# Patient Record
Sex: Female | Born: 1995 | Race: White | Hispanic: No | Marital: Single | Smoking: Never smoker
Health system: Southern US, Community
[De-identification: ages and names within clinical notes are randomized; demographics above are authoritative.]

## PROBLEM LIST (undated history)

## (undated) DIAGNOSIS — K219 Gastro-esophageal reflux disease without esophagitis: Secondary | ICD-10-CM

## (undated) DIAGNOSIS — D649 Anemia, unspecified: Secondary | ICD-10-CM

## (undated) DIAGNOSIS — F419 Anxiety disorder, unspecified: Secondary | ICD-10-CM

## (undated) HISTORY — PX: NO PAST SURGERIES: SHX2092

---
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
## 2017-05-15 ENCOUNTER — Other Ambulatory Visit: Payer: Self-pay | Admitting: Obstetrics and Gynecology

## 2017-05-15 DIAGNOSIS — N644 Mastodynia: Secondary | ICD-10-CM

## 2017-05-17 ENCOUNTER — Ambulatory Visit
Admission: RE | Admit: 2017-05-17 | Discharge: 2017-05-17 | Disposition: A | Discharge status: Home / Self Care | Payer: 59 | Source: Ambulatory Visit | Attending: Obstetrics and Gynecology | Admitting: Obstetrics and Gynecology

## 2017-05-17 DIAGNOSIS — N644 Mastodynia: Secondary | ICD-10-CM

## 2018-04-15 IMAGING — US US BREAST*R* LIMITED INC AXILLA
1 series · 2 of 2 positions shown · non-contrast
Comparison: Previous exam(s).

CLINICAL DATA: 21-year-old patient is sent for evaluation bilateral
breast tenderness. The patient describes a lumpy area in the 11
o'clock region of the left breast with a burning sensation and lumpy
area in the 1 o'clock region of the right breast. There is a family
history of breast cancer in her paternal grandmother in her late
60s. The patient recently started oral contraceptives. Patient's
mother is with her today.

EXAM:
ULTRASOUND OF THE BILATERAL BREASTS

[Series 1: us breast*right* limited inc axilla · 0.07mm/px · 2 of 2 slices shown]
[im 1/2]
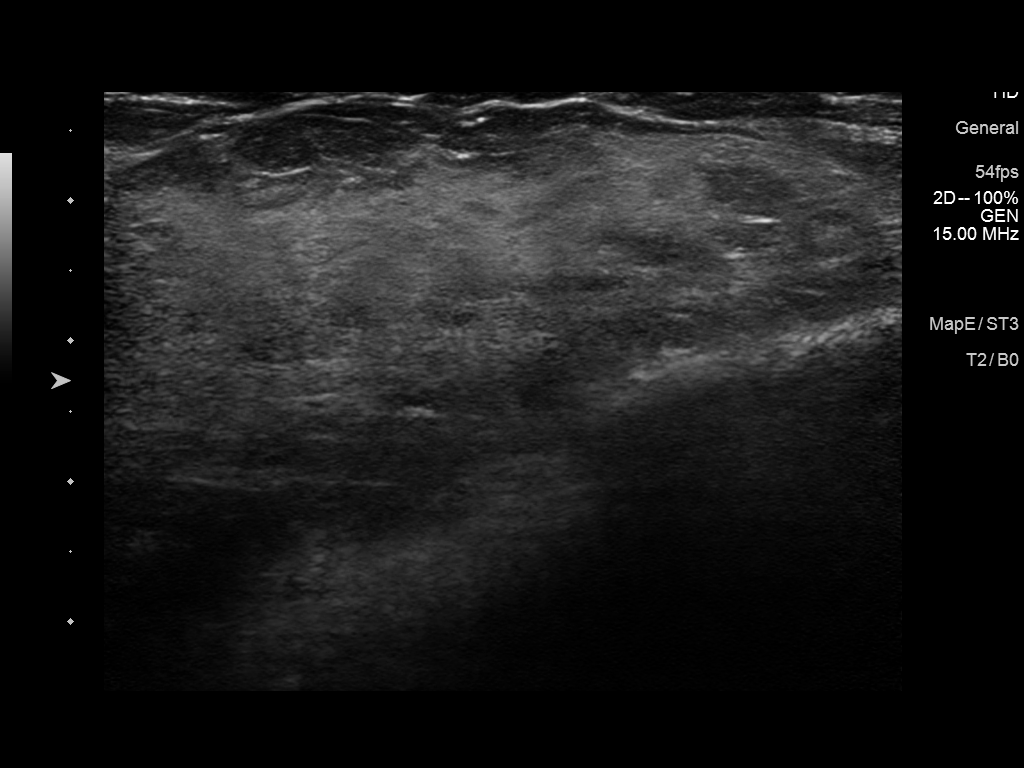
[im 2/2]
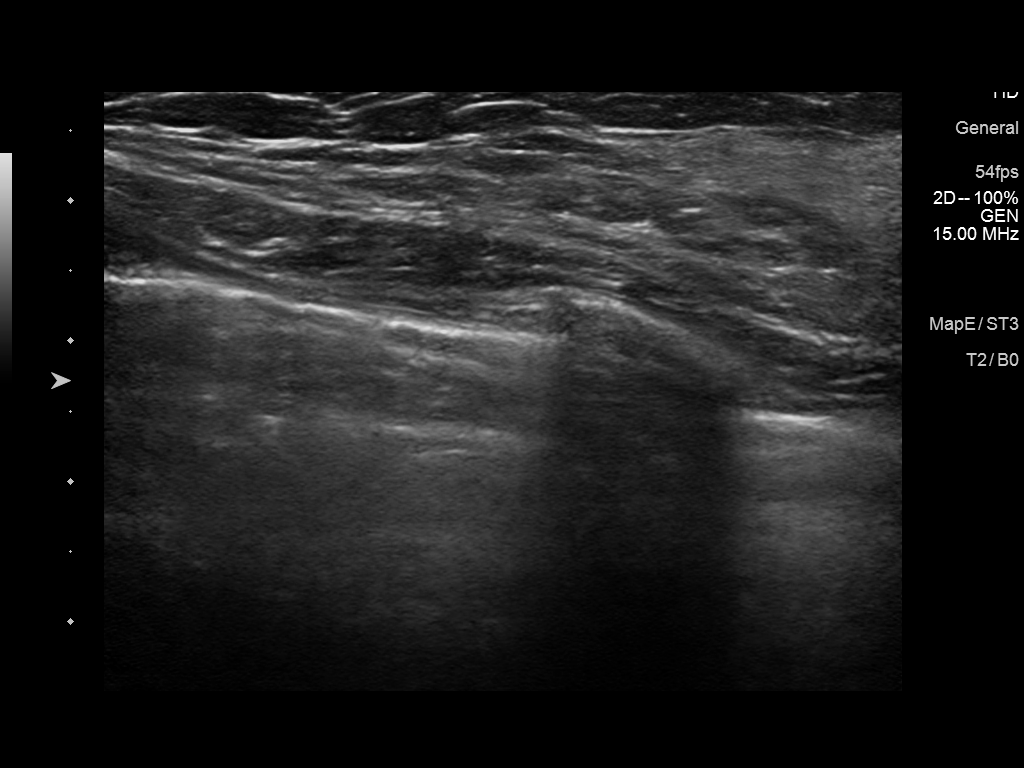

[2 of 2 positions shown; findings below may reference images not displayed]

FINDINGS: On physical exam, I palpate flat, discoid probable fibroglandular
tissue in the 11 o'clock region of the left breast and similar flat,
discoid glandular tissue in the 1 o'clock region of the right
breast. The skin of both breasts appears normal. I do not palpate a
discreet mass in either breast.

Targeted ultrasound is performed, showing dense fibroglandular
tissue in the 11:00 o'clock region of the left breast and dense
fibroglandular tissue and in the 1 o'clock region of the right
breast. No solid or cystic mass or abnormal shadowing is identified
on ultrasound of either breast to suggest malignancy.
IMPRESSION: No sonographic evidence of malignancy in either breast.

RECOMMENDATION:
Continued self breast exams and routine annual doctor's visits. The
patient is asked to return if she has any new or progressive areas
of concern in either breast. Otherwise, annual screening mammography
at 40 is recommended.

I have discussed the findings and recommendations with the patient.
Results were also provided in writing at the conclusion of the
visit. If applicable, a reminder letter will be sent to the patient
regarding the next appointment.

BI-RADS CATEGORY  1: Negative.

## 2018-04-15 IMAGING — US US BREAST*L* LIMITED INC AXILLA
1 series · 1 of 1 positions shown · non-contrast
Comparison: Previous exam(s).

CLINICAL DATA: 21-year-old patient is sent for evaluation bilateral
breast tenderness. The patient describes a lumpy area in the 11
o'clock region of the left breast with a burning sensation and lumpy
area in the 1 o'clock region of the right breast. There is a family
history of breast cancer in her paternal grandmother in her late
60s. The patient recently started oral contraceptives. Patient's
mother is with her today.

EXAM:
ULTRASOUND OF THE BILATERAL BREASTS

[Series 1: us breast*left* limited inc axilla · 0.07mm/px · 1 of 1 slices shown]
[im 1/1]
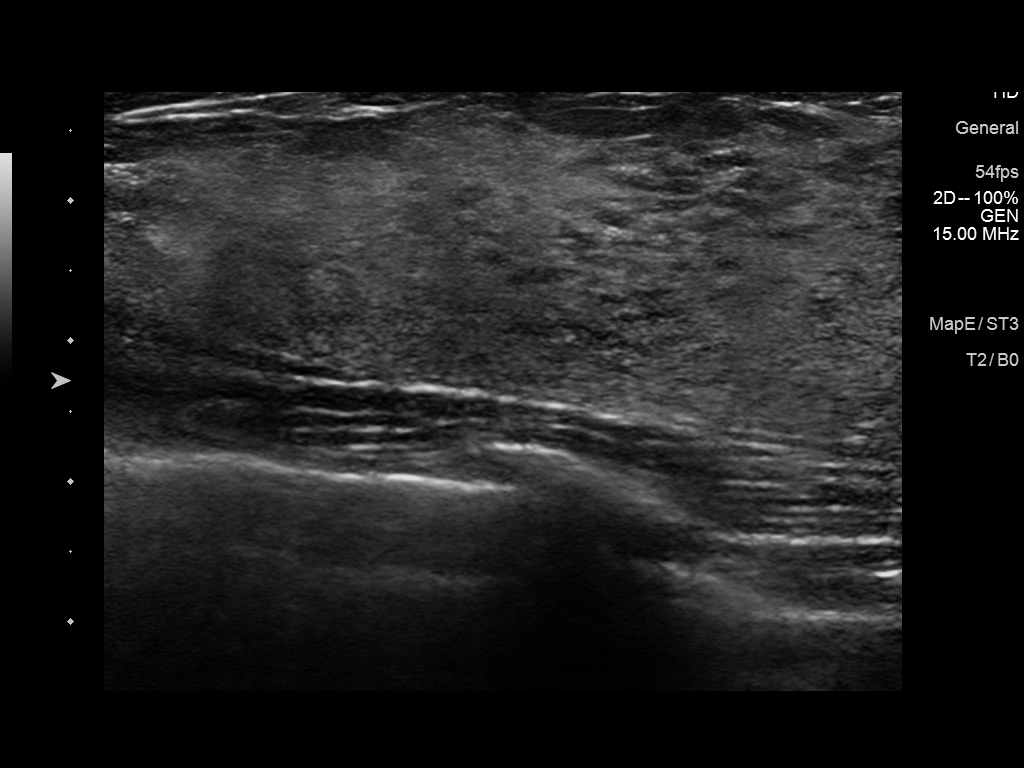

[1 of 1 positions shown; findings below may reference images not displayed]

FINDINGS: On physical exam, I palpate flat, discoid probable fibroglandular
tissue in the 11 o'clock region of the left breast and similar flat,
discoid glandular tissue in the 1 o'clock region of the right
breast. The skin of both breasts appears normal. I do not palpate a
discreet mass in either breast.

Targeted ultrasound is performed, showing dense fibroglandular
tissue in the 11:00 o'clock region of the left breast and dense
fibroglandular tissue and in the 1 o'clock region of the right
breast. No solid or cystic mass or abnormal shadowing is identified
on ultrasound of either breast to suggest malignancy.
IMPRESSION: No sonographic evidence of malignancy in either breast.

RECOMMENDATION:
Continued self breast exams and routine annual doctor's visits. The
patient is asked to return if she has any new or progressive areas
of concern in either breast. Otherwise, annual screening mammography
at 42 is recommended.

I have discussed the findings and recommendations with the patient.
Results were also provided in writing at the conclusion of the
visit. If applicable, a reminder letter will be sent to the patient
regarding the next appointment.

BI-RADS CATEGORY  1: Negative.

## 2020-06-23 ENCOUNTER — Other Ambulatory Visit
Admission: RE | Admit: 2020-06-23 | Discharge: 2020-06-23 | Disposition: A | Discharge status: Home / Self Care | Payer: BC Managed Care – PPO | Source: Ambulatory Visit | Attending: Obstetrics and Gynecology | Admitting: Obstetrics and Gynecology

## 2020-06-23 ENCOUNTER — Other Ambulatory Visit: Payer: Self-pay | Admitting: Obstetrics and Gynecology

## 2020-06-23 ENCOUNTER — Ambulatory Visit
Admission: RE | Admit: 2020-06-23 | Discharge: 2020-06-23 | Disposition: A | Discharge status: Home / Self Care | Payer: BC Managed Care – PPO | Source: Ambulatory Visit | Attending: Obstetrics and Gynecology | Admitting: Obstetrics and Gynecology

## 2020-06-23 ENCOUNTER — Other Ambulatory Visit: Payer: Self-pay

## 2020-06-23 DIAGNOSIS — O200 Threatened abortion: Secondary | ICD-10-CM | POA: Insufficient documentation

## 2020-06-23 DIAGNOSIS — O3680X0 Pregnancy with inconclusive fetal viability, not applicable or unspecified: Secondary | ICD-10-CM | POA: Diagnosis not present

## 2020-06-23 LAB — HCG, QUANTITATIVE, PREGNANCY: hCG, Beta Chain, Quant, S: 3539 m[IU]/mL — ABNORMAL HIGH (ref <5)

## 2020-06-28 LAB — OB RESULTS CONSOLE GC/CHLAMYDIA
Chlamydia: NEGATIVE
Gonorrhea: NEGATIVE

## 2020-07-02 DIAGNOSIS — Z3483 Encounter for supervision of other normal pregnancy, third trimester: Secondary | ICD-10-CM | POA: Insufficient documentation

## 2020-08-09 LAB — OB RESULTS CONSOLE RUBELLA ANTIBODY, IGM: Rubella: IMMUNE

## 2020-08-09 LAB — OB RESULTS CONSOLE VARICELLA ZOSTER ANTIBODY, IGG: Varicella: IMMUNE

## 2020-08-09 LAB — OB RESULTS CONSOLE HEPATITIS B SURFACE ANTIGEN: Hepatitis B Surface Ag: NEGATIVE

## 2021-01-25 LAB — OB RESULTS CONSOLE GBS: GBS: NEGATIVE

## 2021-01-25 LAB — OB RESULTS CONSOLE HIV ANTIBODY (ROUTINE TESTING): HIV: NONREACTIVE

## 2021-02-10 ENCOUNTER — Other Ambulatory Visit: Payer: Self-pay

## 2021-02-10 ENCOUNTER — Encounter
Admission: RE | Admit: 2021-02-10 | Discharge: 2021-02-10 | Disposition: A | Discharge status: Home / Self Care | Payer: BC Managed Care – PPO | Source: Ambulatory Visit | Attending: Obstetrics and Gynecology | Admitting: Obstetrics and Gynecology

## 2021-02-10 HISTORY — DX: Gastro-esophageal reflux disease without esophagitis: K21.9

## 2021-02-10 HISTORY — DX: Anxiety disorder, unspecified: F41.9

## 2021-02-10 HISTORY — DX: Anemia, unspecified: D64.9

## 2021-02-10 NOTE — Patient Instructions (Signed)
Your procedure is scheduled on:02-16-21 Wednesday Report to the Registration Desk on the 1st floor of the Medical Mall.Then proceed to the 3rd floor to Labor and Delivery. Arrive @ 6:30 am  REMEMBER: Instructions that are not followed completely may result in serious medical risk, up to and including death; or upon the discretion of your surgeon and anesthesiologist your surgery may need to be rescheduled.  Do not eat food after midnight the night before surgery.  No gum chewing, lozengers or hard candies.  You may however, drink CLEAR liquids up to 2 hours before you are scheduled to arrive for your surgery. Do not drink anything within 2 hours of your scheduled arrival time.  Clear liquids include: - water  - apple juice without pulp - gatorade (not RED, PURPLE, OR BLUE) - black coffee or tea (Do NOT add milk or creamers to the coffee or tea) Do NOT drink anything that is not on this list.  Do not take any medication the day of surgery  One week prior to surgery: Stop Anti-inflammatories (NSAIDS) such as Advil, Aleve, Ibuprofen, Motrin, Naproxen, Naprosyn and Aspirin based products such as Excedrin, Goodys Powder, BC Powder.You may however, continue to take Tylenol if needed for pain up until the day of surgery.  Stop ANY OVER THE COUNTER supplements/vitamins NOW (02-10-21) until after surgery-Continue your Ferrous Sulfate and Prenatal Vitamin up until the day prior to surgery  No Alcohol for 24 hours before or after surgery.  No Smoking including e-cigarettes for 24 hours prior to surgery.  No chewable tobacco products for at least 6 hours prior to surgery.  No nicotine patches on the day of surgery.  Do not use any "recreational" drugs for at least a week prior to your surgery.  Please be advised that the combination of cocaine and anesthesia may have negative outcomes, up to and including death. If you test positive for cocaine, your surgery will be cancelled.  On the morning  of surgery brush your teeth with toothpaste and water, you may rinse your mouth with mouthwash if you wish. Do not swallow any toothpaste or mouthwash.  Use CHG Soap as directed on instruction sheet (Avoid nipple and private area)  Do not wear jewelry, make-up, hairpins, clips or nail polish.  Do not wear lotions, powders, or perfumes.   Do not shave body from the neck down 48 hours prior to surgery just in case you cut yourself which could leave a site for infection.  Also, freshly shaved skin may become irritated if using the CHG soap  Notify your doctor if there is any change in your medical condition (cold, fever, infection).  Wear comfortable clothing (specific to your surgery type) to the hospital.  After surgery, you can help prevent lung complications by doing breathing exercises.  Take deep breaths and cough every 1-2 hours. Your doctor may order a device called an Incentive Spirometer to help you take deep breaths. When coughing or sneezing, hold a pillow firmly against your incision with both hands. This is called "splinting." Doing this helps protect your incision. It also decreases belly discomfort.  If you are being admitted to the hospital overnight, leave your suitcase in the car. After surgery it may be brought to your room.  If you are being discharged the day of surgery, you will not be allowed to drive home. You will need a responsible adult (18 years or older) to drive you home and stay with you that night.   If you are  taking public transportation, you will need to have a responsible adult (18 years or older) with you. Please confirm with your physician that it is acceptable to use public transportation.   Please call the Pre-admissions Testing Dept. at [phone] if you have any questions about these instructions.  Surgery Visitation Policy:  Patients undergoing a surgery or procedure may have one family member or support person with them as long as that  person is not COVID-19 positive or experiencing its symptoms.  That person may remain in the waiting area during the procedure and may rotate out with other people.  Inpatient Visitation:    Visiting hours are 7 a.m. to 8 p.m. Up to two visitors ages 16+ are allowed at one time in a patient room. The visitors may rotate out with other people during the day. Visitors must check out when they leave, or other visitors will not be allowed. One designated support person may remain overnight. The visitor must pass COVID-19 screenings, use hand sanitizer when entering and exiting the patient's room and wear a mask at all times, including in the patient's room. Patients must also wear a mask when staff or their visitor are in the room. Masking is required regardless of vaccination status.

## 2021-02-10 NOTE — H&P (Signed)
Cynthia Anthony is a 25 y.o. G1P0 with IUP at 39+1 presenting for presenting for scheduled cesarean section.  No acute concerns other than anxiety about the procedure.  Prenatal Course Source of Care: [facility] Pregnancy complications or risks:  H/o mental health diagnoses ? Dx: Anxiety ? Medications prior to pregnancy: never ? Medical team following her: ? Medications during pregnancy: ? 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 Pt has been taking once daily all of pregnancy.  Patient Active Problem List   Diagnosis Date Noted   Supervision of normal pregnancy in third trimester 02/16/2021   She plans to breastfeed She desires  TBD  for postpartum contraception.   Prenatal labs and studies: ABO, Rh: --/--/A POS Performed at [facility], [address] [place], [address] [place]  [phone]) Antibody: NEG (10/17 1108) Rubella: Immune (04/11 0000) RPR: NON REACTIVE (10/17 1108)  HBsAg: Negative (04/11 0000)  HIV: Non-reactive (09/27 0000)  06-27-1970-- (09/27 0000) 1 hr Glucola  [address] XTK:[id number]/ normal  Past Medical History:  Diagnosis Date   Anemia    Anxiety    GERD (gastroesophageal reflux disease)    with pregnancy    Past Surgical History:  Procedure Laterality Date   NO PAST SURGERIES      OB History  Gravida Para Term Preterm AB Living  1            SAB IAB Ectopic Multiple Live Births               # Outcome Date GA Lbr Len/2nd Weight Sex Delivery Anes PTL Lv  1 Current             Social History   Socioeconomic History   Marital status: Single    Spouse name: Not on file   Number of children: Not on file   Years of education: Not on file   Highest education level: Not on file  Occupational History   Not on file  Tobacco Use   Smoking status: Never   Smokeless tobacco: Never   Vaping Use   Vaping Use: Never used  Substance and Sexual Activity   Alcohol use: No   Drug use: No   Sexual activity: Yes    Birth control/protection: Other-see comments    Comment: Unsure  Other Topics Concern   Not on file  Social History Narrative   Not on file   Social Determinants of Health   Financial Resource Strain: Not on file  Food Insecurity: Not on file  Transportation Needs: Not on file  Physical Activity: Not on file  Stress: Not on file  Social Connections: Not on file    Family History  Problem Relation Age of Onset   Breast cancer Paternal Grandmother        late 60's    Medications Prior to Admission  Medication Sig Dispense Refill Last Dose   ferrous sulfate 325 (65 FE) MG tablet Take 325 mg by mouth daily with breakfast.   Past Week   Prenatal Vit-Fe Fumarate-FA (PRENATAL MULTIVITAMIN) TABS tablet Take 1 tablet by mouth daily at 12 noon.   Past Week   valACYclovir (VALTREX) 500 MG tablet Take 500 mg by mouth daily.   Past Week    No Known Allergies  Review of Systems: Negative except for what is mentioned in HPI.  Physical  Exam: BP 130/83 (BP Location: Left Arm)   Pulse 94   Temp 98.4 F (36.9 C) (Oral)   Resp 18   Ht 5\' 2"  (1.575 m)   Wt 61.2 kg   LMP 05/18/2020 (Approximate)   BMI 24.69 kg/m  FHR by Doppler: 140 bpm CONSTITUTIONAL: Well-developed, well-nourished female in no acute distress.  HENT:  Normocephalic, atraumatic, External right and left ear normal. Oropharynx is clear and moist EYES: Conjunctivae and EOM are normal. Pupils are equal, round, and reactive to light. No scleral icterus.  NECK: Normal range of motion, supple, no masses SKIN: Skin is warm and dry. No rash noted. Not diaphoretic. No erythema. No pallor. NEUROLGIC: Alert and oriented to person, place, and time. Normal reflexes, muscle tone coordination. No cranial nerve deficit noted. PSYCHIATRIC: Normal mood and affect. Normal behavior. Normal judgment and  thought content. CARDIOVASCULAR: Normal heart rate noted, regular rhythm RESPIRATORY: Effort and breath sounds normal, no problems with respiration noted ABDOMEN: Soft, nontender, nondistended, gravid.  PELVIC: Deferred MUSCULOSKELETAL: Normal range of motion. No edema and no tenderness. 2+ distal pulses.   Pertinent Labs/Studies:   Results for orders placed or performed during the hospital encounter of 02/16/21 (from the past 72 hour(s))  ABO/Rh     Status: None   Collection Time: 02/16/21  7:35 AM  Result Value Ref Range   ABO/RH(D)      A POS Performed at [facility], [address], [place], [place]     Assessment and Plan :Nabria Nevin is a 25 y.o. G1P0 at 39+1w being admitted being admitted for scheduled cesarean section. The risks of cesarean section discussed with the patient included but were not limited to: bleeding which may require transfusion or reoperation; infection which may require antibiotics; injury to bowel, bladder, ureters or other surrounding organs; injury to the fetus; need for additional procedures including hysterectomy in the event of a life-threatening hemorrhage; placental abnormalities wth subsequent pregnancies, incisional problems, thromboembolic phenomenon and other postoperative/anesthesia complications. The patient concurred with the proposed plan, giving informed written consent for the procedure. Patient has been NPO since last night she will remain NPO for procedure. Anesthesia and OR aware. Preoperative prophylactic antibiotics and SCDs ordered on call to the OR. To OR when ready.

## 2021-02-14 ENCOUNTER — Encounter
Admission: RE | Admit: 2021-02-14 | Discharge: 2021-02-14 | Disposition: A | Discharge status: Home / Self Care | Payer: BC Managed Care – PPO | Source: Ambulatory Visit | Attending: Obstetrics and Gynecology | Admitting: Obstetrics and Gynecology

## 2021-02-14 ENCOUNTER — Other Ambulatory Visit: Payer: Self-pay

## 2021-02-14 ENCOUNTER — Other Ambulatory Visit: Payer: BC Managed Care – PPO

## 2021-02-14 DIAGNOSIS — Z3400 Encounter for supervision of normal first pregnancy, unspecified trimester: Secondary | ICD-10-CM

## 2021-02-14 DIAGNOSIS — Z01812 Encounter for preprocedural laboratory examination: Secondary | ICD-10-CM | POA: Insufficient documentation

## 2021-02-14 DIAGNOSIS — Z20822 Contact with and (suspected) exposure to covid-19: Secondary | ICD-10-CM | POA: Insufficient documentation

## 2021-02-14 LAB — SARS CORONAVIRUS 2 (TAT 6-24 HRS): SARS Coronavirus 2: NEGATIVE

## 2021-02-14 LAB — TYPE AND SCREEN
ABO/RH(D): A POS
Antibody Screen: NEGATIVE
Extend sample reason: UNDETERMINED

## 2021-02-14 LAB — BASIC METABOLIC PANEL
Anion gap: 7 (ref 5–15)
BUN: 6 mg/dL (ref 6–20)
CO2: 24 mmol/L (ref 22–32)
Calcium: 8.7 mg/dL — ABNORMAL LOW (ref 8.9–10.3)
Chloride: 105 mmol/L (ref 98–111)
Creatinine, Ser: 0.41 mg/dL — ABNORMAL LOW (ref 0.44–1.00)
GFR, Estimated: >60 mL/min (ref >60)
Glucose, Bld: 111 mg/dL — ABNORMAL HIGH (ref 70–99)
Potassium: 4 mmol/L (ref 3.5–5.1)
Sodium: 136 mmol/L (ref 135–145)

## 2021-02-14 LAB — CBC
HCT: 34.8 % — ABNORMAL LOW (ref 36.0–46.0)
Hemoglobin: 12.1 g/dL (ref 12.0–15.0)
MCH: 31.8 pg (ref 26.0–34.0)
MCHC: 34.8 g/dL (ref 30.0–36.0)
MCV: 91.3 fL (ref 80.0–100.0)
Platelets: 199 10*3/uL (ref 150–400)
RBC: 3.81 MIL/uL — ABNORMAL LOW (ref 3.87–5.11)
RDW: 14.3 % (ref 11.5–15.5)
WBC: 10.1 10*3/uL (ref 4.0–10.5)
nRBC: 0 % (ref 0.0–0.2)

## 2021-02-15 LAB — RPR: RPR Ser Ql: NONREACTIVE

## 2021-02-16 ENCOUNTER — Inpatient Hospital Stay: Payer: BC Managed Care – PPO | Admitting: Urgent Care

## 2021-02-16 ENCOUNTER — Inpatient Hospital Stay
Admission: RE | Admit: 2021-02-16 | Discharge: 2021-02-18 | DRG: 788 | Disposition: A | Discharge status: Home / Self Care | Payer: BC Managed Care – PPO | Attending: Obstetrics and Gynecology | Admitting: Obstetrics and Gynecology

## 2021-02-16 ENCOUNTER — Other Ambulatory Visit: Payer: Self-pay

## 2021-02-16 ENCOUNTER — Encounter
Admission: RE | Disposition: A | Discharge status: Home / Self Care | Payer: Self-pay | Source: Home / Self Care | Attending: Obstetrics and Gynecology

## 2021-02-16 ENCOUNTER — Encounter: Payer: Self-pay | Admitting: Obstetrics and Gynecology

## 2021-02-16 ENCOUNTER — Inpatient Hospital Stay: Payer: BC Managed Care – PPO

## 2021-02-16 DIAGNOSIS — O26893 Other specified pregnancy related conditions, third trimester: Secondary | ICD-10-CM | POA: Diagnosis present

## 2021-02-16 DIAGNOSIS — Z3A39 39 weeks gestation of pregnancy: Secondary | ICD-10-CM | POA: Diagnosis not present

## 2021-02-16 DIAGNOSIS — Z20822 Contact with and (suspected) exposure to covid-19: Secondary | ICD-10-CM | POA: Diagnosis present

## 2021-02-16 DIAGNOSIS — O9832 Other infections with a predominantly sexual mode of transmission complicating childbirth: Principal | ICD-10-CM | POA: Diagnosis present

## 2021-02-16 DIAGNOSIS — Z3493 Encounter for supervision of normal pregnancy, unspecified, third trimester: Secondary | ICD-10-CM

## 2021-02-16 DIAGNOSIS — A6000 Herpesviral infection of urogenital system, unspecified: Secondary | ICD-10-CM | POA: Diagnosis present

## 2021-02-16 DIAGNOSIS — D696 Thrombocytopenia, unspecified: Secondary | ICD-10-CM | POA: Diagnosis not present

## 2021-02-16 DIAGNOSIS — Z3490 Encounter for supervision of normal pregnancy, unspecified, unspecified trimester: Secondary | ICD-10-CM

## 2021-02-16 DIAGNOSIS — Z3400 Encounter for supervision of normal first pregnancy, unspecified trimester: Secondary | ICD-10-CM

## 2021-02-16 LAB — ABO/RH: ABO/RH(D): A POS

## 2021-02-16 SURGERY — Surgical Case
Anesthesia: Spinal

## 2021-02-16 MED ORDER — NALBUPHINE HCL 10 MG/ML IJ SOLN: 5.0000 mg | INTRAMUSCULAR | Status: DC | PRN

## 2021-02-16 MED ORDER — ONDANSETRON HCL 4 MG/2ML IJ SOLN
INTRAMUSCULAR | Status: AC
  Filled 2021-02-16: qty 2

## 2021-02-16 MED ORDER — KETOROLAC TROMETHAMINE 30 MG/ML IJ SOLN: 30.0000 mg | Freq: Four times a day (QID) | INTRAMUSCULAR | Status: DC

## 2021-02-16 MED ORDER — SIMETHICONE 80 MG PO CHEW
80.0000 mg | CHEWABLE_TABLET | Freq: Three times a day (TID) | ORAL | Status: DC
  Administered 2021-02-16 – 2021-02-18 (×6): 80 mg via ORAL
  Filled 2021-02-16 (×6): qty 1

## 2021-02-16 MED ORDER — FLEET ENEMA 7-19 GM/118ML RE ENEM: 1.0000 | ENEMA | Freq: Every day | RECTAL | Status: DC | PRN

## 2021-02-16 MED ORDER — IBUPROFEN 600 MG PO TABS
600.0000 mg | ORAL_TABLET | Freq: Four times a day (QID) | ORAL | Status: DC
  Administered 2021-02-17 – 2021-02-18 (×4): 600 mg via ORAL
  Filled 2021-02-16 (×4): qty 1

## 2021-02-16 MED ORDER — ARTIFICIAL TEARS OPHTHALMIC OINT
TOPICAL_OINTMENT | OPHTHALMIC | Status: AC
  Filled 2021-02-16: qty 3.5

## 2021-02-16 MED ORDER — ACETAMINOPHEN 500 MG PO TABS
1000.0000 mg | ORAL_TABLET | Freq: Four times a day (QID) | ORAL | Status: DC
  Administered 2021-02-16 – 2021-02-18 (×7): 1000 mg via ORAL
  Filled 2021-02-16 (×7): qty 2

## 2021-02-16 MED ORDER — NALOXONE HCL 0.4 MG/ML IJ SOLN: 0.4000 mg | INTRAMUSCULAR | Status: DC | PRN

## 2021-02-16 MED ORDER — LACTATED RINGERS IV SOLN: INTRAVENOUS | Status: DC

## 2021-02-16 MED ORDER — KETOROLAC TROMETHAMINE 30 MG/ML IJ SOLN
INTRAMUSCULAR | Status: AC
  Filled 2021-02-16: qty 1

## 2021-02-16 MED ORDER — NALBUPHINE HCL 10 MG/ML IJ SOLN: 5.0000 mg | Freq: Once | INTRAMUSCULAR | Status: DC | PRN

## 2021-02-16 MED ORDER — BUPIVACAINE IN DEXTROSE 0.75-8.25 % IT SOLN
INTRATHECAL | Status: DC | PRN
  Administered 2021-02-16: 1.6 mL via INTRATHECAL

## 2021-02-16 MED ORDER — SENNOSIDES-DOCUSATE SODIUM 8.6-50 MG PO TABS
2.0000 | ORAL_TABLET | ORAL | Status: DC
  Administered 2021-02-16 – 2021-02-17 (×2): 2 via ORAL
  Filled 2021-02-16 (×2): qty 2

## 2021-02-16 MED ORDER — SOD CITRATE-CITRIC ACID 500-334 MG/5ML PO SOLN
30.0000 mL | ORAL | Status: AC
  Administered 2021-02-16: 30 mL via ORAL

## 2021-02-16 MED ORDER — ACETAMINOPHEN 325 MG PO TABS: 650.0000 mg | ORAL_TABLET | Freq: Four times a day (QID) | ORAL | Status: DC

## 2021-02-16 MED ORDER — GABAPENTIN 300 MG PO CAPS
300.0000 mg | ORAL_CAPSULE | Freq: Every day | ORAL | Status: DC
  Administered 2021-02-16 – 2021-02-17 (×2): 300 mg via ORAL
  Filled 2021-02-16 (×3): qty 1

## 2021-02-16 MED ORDER — OXYTOCIN-SODIUM CHLORIDE 30-0.9 UT/500ML-% IV SOLN
INTRAVENOUS | Status: DC | PRN
  Administered 2021-02-16: 30 [IU] via INTRAVENOUS

## 2021-02-16 MED ORDER — BUPIVACAINE HCL (PF) 0.5 % IJ SOLN
INTRAMUSCULAR | Status: AC
  Filled 2021-02-16: qty 30

## 2021-02-16 MED ORDER — OXYTOCIN-SODIUM CHLORIDE 30-0.9 UT/500ML-% IV SOLN
2.5000 [IU]/h | INTRAVENOUS | Status: AC
  Administered 2021-02-16: 2.5 [IU]/h via INTRAVENOUS
  Filled 2021-02-16: qty 500

## 2021-02-16 MED ORDER — FENTANYL CITRATE (PF) 100 MCG/2ML IJ SOLN
INTRAMUSCULAR | Status: AC
  Filled 2021-02-16: qty 2

## 2021-02-16 MED ORDER — ONDANSETRON HCL 4 MG/2ML IJ SOLN: 4.0000 mg | Freq: Three times a day (TID) | INTRAMUSCULAR | Status: DC | PRN

## 2021-02-16 MED ORDER — LACTATED RINGERS IV SOLN: Freq: Once | INTRAVENOUS | Status: DC

## 2021-02-16 MED ORDER — SIMETHICONE 80 MG PO CHEW: 80.0000 mg | CHEWABLE_TABLET | ORAL | Status: DC | PRN

## 2021-02-16 MED ORDER — KETOROLAC TROMETHAMINE 30 MG/ML IJ SOLN
30.0000 mg | Freq: Four times a day (QID) | INTRAMUSCULAR | Status: AC
  Administered 2021-02-16 – 2021-02-17 (×4): 30 mg via INTRAVENOUS
  Filled 2021-02-16 (×4): qty 1

## 2021-02-16 MED ORDER — MEPERIDINE HCL 25 MG/ML IJ SOLN: 6.2500 mg | INTRAMUSCULAR | Status: DC | PRN

## 2021-02-16 MED ORDER — ONDANSETRON HCL 4 MG/2ML IJ SOLN
INTRAMUSCULAR | Status: DC | PRN
  Administered 2021-02-16: 4 mg via INTRAVENOUS

## 2021-02-16 MED ORDER — SOD CITRATE-CITRIC ACID 500-334 MG/5ML PO SOLN
ORAL | Status: AC
  Filled 2021-02-16: qty 15

## 2021-02-16 MED ORDER — FENTANYL CITRATE (PF) 100 MCG/2ML IJ SOLN
INTRAMUSCULAR | Status: DC | PRN
  Administered 2021-02-16: 15 ug via INTRATHECAL

## 2021-02-16 MED ORDER — CEFAZOLIN SODIUM-DEXTROSE 2-4 GM/100ML-% IV SOLN
2.0000 g | INTRAVENOUS | Status: DC
  Filled 2021-02-16: qty 100

## 2021-02-16 MED ORDER — OXYTOCIN-SODIUM CHLORIDE 30-0.9 UT/500ML-% IV SOLN
INTRAVENOUS | Status: AC
  Filled 2021-02-16: qty 500

## 2021-02-16 MED ORDER — BUPIVACAINE LIPOSOME 1.3 % IJ SUSP
INTRAMUSCULAR | Status: AC
  Filled 2021-02-16: qty 20

## 2021-02-16 MED ORDER — MEASLES, MUMPS & RUBELLA VAC IJ SOLR
0.5000 mL | Freq: Once | INTRAMUSCULAR | Status: DC
  Filled 2021-02-16: qty 0.5

## 2021-02-16 MED ORDER — DIPHENHYDRAMINE HCL 25 MG PO CAPS: 25.0000 mg | ORAL_CAPSULE | ORAL | Status: DC | PRN

## 2021-02-16 MED ORDER — PHENYLEPHRINE HCL-NACL 20-0.9 MG/250ML-% IV SOLN
INTRAVENOUS | Status: DC | PRN
  Administered 2021-02-16: 40 ug/min via INTRAVENOUS

## 2021-02-16 MED ORDER — PHENYLEPHRINE HCL-NACL 20-0.9 MG/250ML-% IV SOLN
INTRAVENOUS | Status: AC
  Filled 2021-02-16: qty 250

## 2021-02-16 MED ORDER — OXYCODONE HCL 5 MG PO TABS: 5.0000 mg | ORAL_TABLET | ORAL | Status: DC | PRN

## 2021-02-16 MED ORDER — DIPHENHYDRAMINE HCL 25 MG PO CAPS: 25.0000 mg | ORAL_CAPSULE | Freq: Four times a day (QID) | ORAL | Status: DC | PRN

## 2021-02-16 MED ORDER — PRENATAL MULTIVITAMIN CH
1.0000 | ORAL_TABLET | Freq: Every day | ORAL | Status: DC
  Administered 2021-02-16 – 2021-02-17 (×2): 1 via ORAL
  Filled 2021-02-16 (×2): qty 1

## 2021-02-16 MED ORDER — FERROUS SULFATE 325 (65 FE) MG PO TABS
325.0000 mg | ORAL_TABLET | Freq: Two times a day (BID) | ORAL | Status: DC
  Administered 2021-02-16 – 2021-02-18 (×4): 325 mg via ORAL
  Filled 2021-02-16 (×4): qty 1

## 2021-02-16 MED ORDER — DIPHENHYDRAMINE HCL 50 MG/ML IJ SOLN: 12.5000 mg | INTRAMUSCULAR | Status: DC | PRN

## 2021-02-16 MED ORDER — WITCH HAZEL-GLYCERIN EX PADS: 1.0000 "application " | MEDICATED_PAD | CUTANEOUS | Status: DC | PRN

## 2021-02-16 MED ORDER — SODIUM CHLORIDE 0.9% FLUSH: 3.0000 mL | INTRAVENOUS | Status: DC | PRN

## 2021-02-16 MED ORDER — TETANUS-DIPHTH-ACELL PERTUSSIS 5-2.5-18.5 LF-MCG/0.5 IM SUSY
0.5000 mL | PREFILLED_SYRINGE | Freq: Once | INTRAMUSCULAR | Status: DC
  Filled 2021-02-16: qty 0.5

## 2021-02-16 MED ORDER — MENTHOL 3 MG MT LOZG
1.0000 | LOZENGE | OROMUCOSAL | Status: DC | PRN
  Filled 2021-02-16: qty 9

## 2021-02-16 MED ORDER — DIBUCAINE (PERIANAL) 1 % EX OINT: 1.0000 "application " | TOPICAL_OINTMENT | CUTANEOUS | Status: DC | PRN

## 2021-02-16 MED ORDER — LACTATED RINGERS IV SOLN: Freq: Once | INTRAVENOUS | Status: AC

## 2021-02-16 MED ORDER — NALOXONE HCL 4 MG/10ML IJ SOLN
1.0000 ug/kg/h | INTRAVENOUS | Status: DC | PRN
  Filled 2021-02-16: qty 5

## 2021-02-16 MED ORDER — BISACODYL 10 MG RE SUPP
10.0000 mg | Freq: Every day | RECTAL | Status: DC | PRN
  Filled 2021-02-16: qty 1

## 2021-02-16 MED ORDER — COCONUT OIL OIL
1.0000 "application " | TOPICAL_OIL | Status: DC | PRN
  Filled 2021-02-16 (×2): qty 120

## 2021-02-16 MED ORDER — OXYCODONE HCL 5 MG PO TABS: 10.0000 mg | ORAL_TABLET | ORAL | Status: DC | PRN

## 2021-02-16 MED ORDER — MORPHINE SULFATE (PF) 0.5 MG/ML IJ SOLN
INTRAMUSCULAR | Status: DC | PRN
  Administered 2021-02-16: .1 mg via INTRATHECAL

## 2021-02-16 MED ORDER — POVIDONE-IODINE 10 % EX SWAB
2.0000 "application " | Freq: Once | CUTANEOUS | Status: AC
  Administered 2021-02-16: 2 via TOPICAL

## 2021-02-16 MED ORDER — SODIUM CHLORIDE FLUSH 0.9 % IV SOLN
INTRAVENOUS | Status: DC | PRN
  Administered 2021-02-16: 100 mL

## 2021-02-16 MED ORDER — MORPHINE SULFATE (PF) 0.5 MG/ML IJ SOLN
INTRAMUSCULAR | Status: AC
  Filled 2021-02-16: qty 10

## 2021-02-16 MED ORDER — SODIUM CHLORIDE (PF) 0.9 % IJ SOLN
INTRAMUSCULAR | Status: AC
  Filled 2021-02-16: qty 50

## 2021-02-16 SURGICAL SUPPLY — 32 items
CHLORAPREP W/TINT 26 (MISCELLANEOUS) ×2 IMPLANT
DRSG EMULSION OIL 3X8 NADH (GAUZE/BANDAGES/DRESSINGS) ×2 IMPLANT
DRSG TELFA 3X8 NADH (GAUZE/BANDAGES/DRESSINGS) ×2 IMPLANT
ELECT REM PT RETURN 9FT ADLT (ELECTROSURGICAL) ×2
ELECTRODE REM PT RTRN 9FT ADLT (ELECTROSURGICAL) ×1 IMPLANT
EXTRACTOR VACUUM KIWI (MISCELLANEOUS) ×2 IMPLANT
GAUZE SPONGE 4X4 12PLY STRL (GAUZE/BANDAGES/DRESSINGS) ×2 IMPLANT
GAUZE SPONGE 4X4 12PLY STRL LF (GAUZE/BANDAGES/DRESSINGS) ×2 IMPLANT
GOWN STRL REUS W/ TWL LRG LVL3 (GOWN DISPOSABLE) ×3 IMPLANT
GOWN STRL REUS W/TWL LRG LVL3 (GOWN DISPOSABLE) ×3
MANIFOLD NEPTUNE II (INSTRUMENTS) ×2 IMPLANT
MAT PREVALON FULL STRYKER (MISCELLANEOUS) ×2 IMPLANT
NEEDLE HYPO 25GX1X1/2 BEV (NEEDLE) ×2 IMPLANT
NS IRRIG 1000ML POUR BTL (IV SOLUTION) ×2 IMPLANT
PACK C SECTION AR (MISCELLANEOUS) ×2 IMPLANT
PAD OB MATERNITY 4.3X12.25 (PERSONAL CARE ITEMS) ×2 IMPLANT
PAD PREP 24X41 OB/GYN DISP (PERSONAL CARE ITEMS) ×2 IMPLANT
PENCIL SMOKE EVACUATOR (MISCELLANEOUS) ×2 IMPLANT
SCRUB EXIDINE 4% CHG 4OZ (MISCELLANEOUS) ×2 IMPLANT
STAPLER INSORB 30 2030 C-SECTI (MISCELLANEOUS) ×2 IMPLANT
SUT MNCRL 4-0 (SUTURE) ×1
SUT MNCRL 4-0 27XMFL (SUTURE) ×1
SUT VIC AB 0 CT1 36 (SUTURE) ×4 IMPLANT
SUT VIC AB 0 CTX 36 (SUTURE) ×2
SUT VIC AB 0 CTX36XBRD ANBCTRL (SUTURE) ×2 IMPLANT
SUT VIC AB 2-0 SH 27 (SUTURE) ×2
SUT VIC AB 2-0 SH 27XBRD (SUTURE) ×2 IMPLANT
SUTURE MNCRL 4-0 27XMF (SUTURE) ×1 IMPLANT
SYR 30ML LL (SYRINGE) ×4 IMPLANT
TAPE PAPER 2X10 WHT MICROPORE (GAUZE/BANDAGES/DRESSINGS) ×2 IMPLANT
TAPE STRIPS DRAPE STRL (GAUZE/BANDAGES/DRESSINGS) ×2 IMPLANT
WATER STERILE IRR 500ML POUR (IV SOLUTION) ×2 IMPLANT

## 2021-02-16 NOTE — Transfer of Care (Signed)
Immediate Anesthesia Transfer of Care Note  Patient: Cynthia Anthony  Procedure(s) Performed: CESAREAN SECTION  Patient Location: PACU  Anesthesia Type:Spinal  Level of Consciousness: awake, alert  and oriented  Airway & Oxygen Therapy: Patient Spontanous Breathing  Post-op Assessment: Report given to RN and Post -op Vital signs reviewed and stable  Post vital signs: Reviewed and stable  Last Vitals:  Vitals Value Taken Time  BP 100/79 02/16/21 1045  Temp 36.4 C 02/16/21 1045  Pulse 73 02/16/21 1045  Resp 24 02/16/21 1045  SpO2 99 % 02/16/21 1045    Last Pain:  Vitals:   02/16/21 1045  TempSrc:   PainSc: 0-No pain         Complications: No notable events documented.

## 2021-02-16 NOTE — Anesthesia Postprocedure Evaluation (Signed)
Anesthesia Post Note  Patient: Cynthia Anthony  Procedure(s) Performed: CESAREAN SECTION  Patient location during evaluation: PACU Anesthesia Type: Spinal Level of consciousness: awake and alert, oriented and patient cooperative Pain management: pain level controlled Vital Signs Assessment: post-procedure vital signs reviewed and stable Respiratory status: spontaneous breathing, nonlabored ventilation and respiratory function stable Cardiovascular status: blood pressure returned to baseline and stable Postop Assessment: adequate PO intake Anesthetic complications: no   No notable events documented.   Last Vitals:  Vitals:   02/16/21 1059 02/16/21 1100  BP:    Pulse: 76 76  Resp: (!) 23 (!) 21  Temp:    SpO2: 98% 99%    Last Pain:  Vitals:   02/16/21 1045  TempSrc:   PainSc: 0-No pain                 Reed Breech

## 2021-02-16 NOTE — Lactation Note (Signed)
This note was copied from a baby's chart. Lactation Consultation Note  Patient Name: Cynthia Anthony [id number]'V Date: 02/16/2021 Reason for consult: Initial assessment;Primapara;Term;Other (Comment) (elective c-section) Age:23 hours  Mom is P1, primary elective c-section delivery. Baby has had 2 feedings since delivery, LATCH by Transition RN, and 1 occurrence of spit-up.  LC Student provided newborn breastfeeding education: stomach size, feeding patterns and behaviors, early cues, not watching the clock, and output expectations.  Guidance given for anticipated cluster feeding behavior over night, encouraged on demand feedings during this time, and benefits of building milk supply for baby.   LC spoke with parents re: Neo's note of mild tongue tie. We discussed signs of impact, possibility of baby continuing to feed well without and the support available if feedings become difficult. Encouraged to keep an eye on how feedings were feeling overnight. (Mom does have her own nipple shields, but she was still encouraged to seek support tonight if needed for appropriate sizing).   Lactation follow-up tomorrow.  Maternal Data Does the patient have breastfeeding experience prior to this delivery?: No  Feeding Mother's Current Feeding Choice: Breast Milk and Formula  LATCH Score Latch:  (baby sleeping)                  Lactation Tools Discussed/Used    Interventions Interventions: Breast feeding basics reviewed;Education  Discharge    Consult Status Consult Status: Follow-up Date: 02/16/21 Follow-up type: In-patient    Danford Bad 02/16/2021, 4:28 PM

## 2021-02-16 NOTE — Anesthesia Preprocedure Evaluation (Signed)
Anesthesia Evaluation  Patient identified by MRN, date of birth, ID band Patient awake    Reviewed: Allergy & Precautions, NPO status , Patient's Chart, lab work & pertinent test results  History of Anesthesia Complications Negative for: history of anesthetic complications  Airway Mallampati: II  TM Distance: >3 FB Neck ROM: Full    Dental no notable dental hx.    Pulmonary neg pulmonary ROS, neg sleep apnea,    breath sounds clear to auscultation- rhonchi (-) wheezing      Cardiovascular Exercise Tolerance: Good (-) hypertension(-) CAD, (-) Past MI, (-) Cardiac Stents and (-) CABG  Rhythm:Regular Rate:Normal - Systolic murmurs and - Diastolic murmurs    Neuro/Psych neg Seizures PSYCHIATRIC DISORDERS Anxiety negative neurological ROS     GI/Hepatic Neg liver ROS, GERD  ,  Endo/Other  negative endocrine ROSneg diabetes  Renal/GU negative Renal ROS     Musculoskeletal negative musculoskeletal ROS (+)   Abdominal (+) - obese,   Peds  Hematology  (+) anemia ,   Anesthesia Other Findings Past Medical History: No date: Anemia No date: Anxiety No date: GERD (gastroesophageal reflux disease)     Comment:  with pregnancy   Reproductive/Obstetrics (+) Pregnancy                             Lab Results  Component Value Date   WBC 10.1 02/14/2021   HGB 12.1 02/14/2021   HCT 34.8 (L) 02/14/2021   MCV 91.3 02/14/2021   PLT 199 02/14/2021    Anesthesia Physical Anesthesia Plan  ASA: 2  Anesthesia Plan: Spinal   Post-op Pain Management:    Induction:   PONV Risk Score and Plan: 2 and Ondansetron  Airway Management Planned: Natural Airway  Additional Equipment:   Intra-op Plan:   Post-operative Plan:   Informed Consent: I have reviewed the patients History and Physical, chart, labs and discussed the procedure including the risks, benefits and alternatives for the proposed  anesthesia with the patient or authorized representative who has indicated his/her understanding and acceptance.     Dental advisory given  Plan Discussed with: CRNA and Anesthesiologist  Anesthesia Plan Comments:         Anesthesia Quick Evaluation

## 2021-02-16 NOTE — Op Note (Signed)
  Cesarean Section Procedure Note  Date of procedure: 02/16/2021   Pre-operative Diagnosis: Intrauterine pregnancy at [redacted]w[redacted]d; elective  Post-operative Diagnosis: same, delivered.  Procedure: Primary Low Transverse Cesarean Section through Pfannenstiel incision  Surgeon: Christeen Douglas, MD  Assistant(s):  Donato Schultz, CNM   Anesthesia: Spinal anesthesia  Anesthesiologist: Alver Fisher, MD Anesthesiologist: Alver Fisher, MD CRNA: Karoline Caldwell, CRNA  Estimated Blood Loss:   750         Drains: foley         Total IV Fluids: see anesthesia  Urine Output: see anesthesia          Specimens: none         Complications:  None; patient tolerated the procedure well.         Disposition: PACU - hemodynamically stable.         Condition: stable  Findings:  A female infant "Paisley" in cephalic presentation. Amniotic fluid - Clear  Birth weight 7#2oz.  Apgars of 7 and 9 at one and five minutes respectively.  Intact placenta with a three-vessel cord.  Grossly normal uterus, tubes and ovaries bilaterally. No intraabdominal adhesions were noted.  Indications:  maternal request  Procedure Details  The patient was taken to Operating Room, identified as the correct patient and the procedure verified as C-Section Delivery. A formal Time Out was held with all team members present and in agreement.  After induction of anesthesia, the patient was draped and prepped in the usual sterile manner. A Pfannenstiel skin incision was made and carried down through the subcutaneous tissue to the fascia. Fascial incision was made and extended transversely with the Mayo scissors. The fascia was separated from the underlying rectus tissue superiorly and inferiorly. The peritoneum was identified and entered bluntly. Peritoneal incision was extended longitudinally. The utero-vesical peritoneal reflection was incised transversely and a bladder flap was created digitally.   A low transverse  hysterotomy was made. The fetus was delivered atraumatically. The umbilical cord was clamped x2 and cut and the infant was handed to the awaiting pediatricians. The placenta was removed intact and appeared normal, intact, and with a 3-vessel cord.   The uterus was exteriorized and cleared of all clot and debris. The hysterotomy was closed with running sutures of 0-Vicryl. A second imbricating layer was not required. Excellent hemostasis was observed. The peritoneal cavity was cleared of all clots and debris. The uterus was returned to the abdomen.   The pelvis was irrigated and again, excellent hemostasis was noted. The fascia was then reapproximated with running sutures of 0 Vicryl. The subcutaneous tissue was reapproximated with running sutures of 0 Vicry. The skin was reapproximated with Ensorb/ 49ml (in 30 of 0.5% bupivicaine and 55ml of NSS) of liposomal bupivicaine placed in the fascial and skin lines.  Instrument, sponge, and needle counts were correct prior to the abdominal closure and at the conclusion of the case.   The patient tolerated the procedure well and was transferred to the recovery room in stable condition.   Christeen Douglas, MD 02/16/2021

## 2021-02-17 ENCOUNTER — Encounter: Payer: Self-pay | Admitting: Obstetrics and Gynecology

## 2021-02-17 LAB — CBC
HCT: 33.6 % — ABNORMAL LOW (ref 36.0–46.0)
Hemoglobin: 11.5 g/dL — ABNORMAL LOW (ref 12.0–15.0)
MCH: 31.5 pg (ref 26.0–34.0)
MCHC: 34.2 g/dL (ref 30.0–36.0)
MCV: 92.1 fL (ref 80.0–100.0)
Platelets: 144 10*3/uL — ABNORMAL LOW (ref 150–400)
RBC: 3.65 MIL/uL — ABNORMAL LOW (ref 3.87–5.11)
RDW: 14.6 % (ref 11.5–15.5)
WBC: 15.7 10*3/uL — ABNORMAL HIGH (ref 4.0–10.5)
nRBC: 0 % (ref 0.0–0.2)

## 2021-02-17 MED ORDER — PROPOFOL 500 MG/50ML IV EMUL
INTRAVENOUS | Status: AC
  Filled 2021-02-17: qty 50

## 2021-02-17 MED ORDER — PROPOFOL 500 MG/50ML IV EMUL
INTRAVENOUS | Status: AC
  Filled 2021-02-17: qty 100

## 2021-02-17 NOTE — Progress Notes (Signed)
Tried to pull foley at 2200. Pt accepting of plan of care, then asked her mother "what do you think?" Pt's mother responded she needs her rest and is in pain (pt previously rates pain at 3/10 and only wants Tylenol and Toradol) Pt then refused removal at that time. Pt educated on importance of foley removal and early ambulation. Pt requests catheter out "in the morning". I suggested 0600 and pt agreeable. Pt verbalized desire for rest, at 2315, after pt care, I contracted with pt we would return at 0200 and 0600 for pt care times and respect rest periods between care. Pt agreeable. RN's entered room at 0600 to remove foley, pt was agreeable, then looked to her mother, and mother replied, "I don't think 2 more hours will hurt. She needs to rest. Rest is more important in her recovery than that thing coming out right now. She ain't slept in 2 days." Pt then requests 2 more hours of sleep. I told pt I will put a do not disturb sign on the door and day shift will see her at 0800 for foley removal.

## 2021-02-17 NOTE — Progress Notes (Signed)
Postop Day  1  Subjective: tolerating PO  Doing well, no concerns. Resting in bed, pain managed with PO/IV meds, tolerating regular diet, foley cath in situ, draining clear, yellow urine.   No fever/chills, chest pain, shortness of breath, nausea/vomiting, or leg pain. No nipple or breast pain. No headache, visual changes, or RUQ/epigastric pain.  Objective: BP 107/74 (BP Location: Left Arm)   Pulse 87   Temp 98.7 F (37.1 C) (Oral)   Resp 20   Ht 5\' 2"  (1.575 m)   Wt 61.2 kg   LMP 05/18/2020 (Approximate)   SpO2 100%   Breastfeeding Unknown   BMI 24.69 kg/m    Physical Exam:  General: alert, cooperative, and no distress Breasts: soft/nontender CV: RRR Pulm: nl effort, CTABL Abdomen: soft, non-tender, active bowel sounds Uterine Fundus: firm Incision: no significant drainage, covered with occlusive pressure dressing  Perineum: minimal edema, intact Lochia: appropriate DVT Evaluation: No evidence of DVT seen on physical exam.  Recent Labs    02/14/21 1108 02/17/21 0453  HGB 12.1 11.5*  HCT 34.8* 33.6*  WBC 10.1 15.7*  PLT 199 144*    Assessment/Plan: 25 y.o. G1P1001 postpartum day # 1  -Continue routine postpartum care -Lactation consult PRN for breastfeeding  -Plan to remove foley catheter this morning  -Ambulate in room -Abdominal binder PRN  -CBC reviewed - mild thrombocytopenia, hemodynamically stable  -Immunization status:   all immunizations up to date   Disposition: Continue inpatient postpartum care   LOS: 1 day   30, CNM 02/17/2021, 8:30 AM   ----- 02/19/2021  Certified Nurse Midwife [facility]

## 2021-02-17 NOTE — Lactation Note (Signed)
This note was copied from a baby's chart. Lactation Consultation Note  Patient Name: [name] [id number] Date: 02/17/2021 Reason for consult: Follow-up assessment;Primapara;Term;Other (Comment) (mild tongue tie, primary c-section) Age:23 hours  Lactation follow-up. Parents report baby fed well overnight, some cluster feeding/shorter period feedings. Output exceeds minimum expectations for HOL. Mom notes no pain/discomfort with feedings at this time (in reference to suspected mild tongue tie).  Grandmother has questions re: formula to be given if needed. LC provided mom with reassurance that she is feeding well, and gave guidance that early formula introduction can inhibit breastfeeding success. Grandmother insists on having the formula at home in case.   Encouraged continued breastfeeding efforts at this time and calling out for support as needed.  Maternal Data Has patient been taught Hand Expression?: Yes Does the patient have breastfeeding experience prior to this delivery?: No  Feeding Mother's Current Feeding Choice: Breast Milk and Formula  LATCH Score                    Lactation Tools Discussed/Used    Interventions Interventions: Education;Breast feeding basics reviewed  Discharge    Consult Status Consult Status: Follow-up Date: 02/17/21 Follow-up type: In-patient    Danford Bad 02/17/2021, 10:11 AM

## 2021-02-18 ENCOUNTER — Ambulatory Visit: Payer: Self-pay

## 2021-02-18 MED ORDER — OXYCODONE HCL 5 MG PO TABS: 5.0000 mg | ORAL_TABLET | ORAL | 0 refills | Status: DC | PRN

## 2021-02-18 NOTE — Discharge Summary (Signed)
Obstetrical Discharge Summary  Patient Name: Cynthia Anthony DOB: Aug 08, 1995 MRN: [id number]  Date of Admission: 02/16/2021 Date of Delivery: 02/16/21 Delivered by: Dr. Dalbert Garnet Date of Discharge: 02/18/2021  Primary OB: [facility]   PZW:CHENIDP'O last menstrual period was 05/18/2020 (approximate). EDC Estimated Date of Delivery: 02/22/21 Gestational Age at Delivery: [redacted]w[redacted]d   Antepartum complications:  H/o mental health diagnoses Dx: Anxiety Pelvic floor dysfunction (sounds like vaginismus?) Genital Herpes Admitting Diagnosis: Scheduled Primary C/s Secondary Diagnosis: Patient Active Problem List   Diagnosis Date Noted   Cesarean delivery delivered 02/18/2021   Encounter for supervision of other normal pregnancy, third trimester 07/02/2020    Augmentation: N/A Complications: Hemorrhage>1040mL  Intrapartum complications/course:  Delivery Type: primary cesarean section, low transverse incision Anesthesia: spinal Placenta: see op note Laceration:  Episiotomy: none Newborn Data: Live born female  Birth Weight: 7 lb 2.3 oz (3240 g) APGAR: 7, 9  Newborn Delivery   Birth date/time: 02/16/2021 09:48:00 Delivery type: C-Section, Vacuum Assisted Trial of labor: No C-section categorization: Primary      25 y.o. G1P1001 at [redacted]w[redacted]d presenting for scheduled primary c/s. See op note for details  Postpartum Procedures: none  Post partum course:  Patient had an uncomplicated postpartum course.  By time of discharge on POD#2, her pain was controlled on oral pain medications; she had appropriate lochia and was ambulating, voiding without difficulty, tolerating regular diet and passing flatus.   She was deemed stable for discharge to home.    Discharge Physical Exam:  BP (!) 102/57 (BP Location: Right Arm)   Pulse 66   Temp 97.8 F (36.6 C) (Oral)   Resp 18   Ht 5\' 2"  (1.575 m)   Wt 61.2 kg   LMP 05/18/2020 (Approximate)   SpO2 100%   Breastfeeding Unknown   BMI  24.69 kg/m   General: alert and no distress Pulm: normal respiratory effort Lochia: appropriate Abdomen: soft, NT Uterine Fundus: firm, below umbilicus Incision: honeycomb dsg in place, c/d/i, healing well, no significant drainage, no dehiscence, no significant erythema Extremities: No evidence of DVT seen on physical exam. No lower extremity edema. Edinburgh:  Edinburgh Postnatal Depression Scale Screening Tool 02/17/2021 02/17/2021 02/16/2021  I have been able to laugh and see the funny side of things. 0 (No Data) (No Data)  I have looked forward with enjoyment to things. 0 - -  I have blamed myself unnecessarily when things went wrong. 0 - -  I have been anxious or worried for no good reason. 0 - -  I have felt scared or panicky for no good reason. 0 - -  Things have been getting on top of me. 0 - -  I have been so unhappy that I have had difficulty sleeping. 0 - -  I have felt sad or miserable. 0 - -  I have been so unhappy that I have been crying. 0 - -  The thought of harming myself has occurred to me. 0 - -  Edinburgh Postnatal Depression Scale Total 0 - -     Labs: CBC Latest Ref Rng & Units 02/17/2021 02/14/2021  WBC 4.0 - 10.5 K/uL 15.7(H) 10.1  Hemoglobin 12.0 - 15.0 g/dL 11.5(L) 12.1  Hematocrit 36.0 - 46.0 % 33.6(L) 34.8(L)  Platelets 150 - 400 K/uL 144(L) 199   A POS Performed at [facility], [address] [place], [place]  Hemoglobin  Date Value Ref Range Status  02/17/2021 11.5 (L) 12.0 - 15.0 g/dL Final   HCT  Date Value Ref Range Status  02/17/2021 33.6 (L) 36.0 - 46.0 % Final    Disposition: stable, discharge to home Baby Feeding: breastmilk Baby Disposition: home with mom  Contraception: TBD  Prenatal Labs:  Blood type/Rh A pos  Antibody screen neg  Rubella Immune  Varicella Immune  RPR NR  HBsAg Neg  HIV NR  GC neg  Chlamydia neg  Genetic screening negative  1 hour GTT 101  3 hour GTT   GBS neg   Rh Immune  globulin given: n/a Rubella vaccine given: Immune Varicella vaccine given: Immune Tdap vaccine given in AP or PP setting: given 12/27/20 Flu vaccine given in AP or PP setting: not in AP setting  Plan: Cynthia Anthony was discharged to home in good condition. Follow-up appointment with delivering provider in 6 weeks.  Discharge Instructions: Per After Visit Summary. Activity: Advance as tolerated. Pelvic rest for 6 weeks.   Diet: Regular Discharge Medications: Allergies as of 02/18/2021   No Known Allergies      Medication List     TAKE these medications    ferrous sulfate 325 (65 FE) MG tablet Take 325 mg by mouth daily with breakfast.   oxyCODONE 5 MG immediate release tablet Commonly known as: Oxy IR/ROXICODONE Take 1-2 tablets (5-10 mg total) by mouth every 4 (four) hours as needed for moderate pain.   prenatal multivitamin Tabs tablet Take 1 tablet by mouth daily at 12 noon.   valACYclovir 500 MG tablet Commonly known as: VALTREX Take 500 mg by mouth daily.       Outpatient follow up:   Follow-up Information     Christeen Douglas, MD Follow up.   Specialty: Obstetrics and Gynecology Contact information: [address] [place] [address] [phone]                 Signed: Quillian Quince 02/18/2021 10:17 AM

## 2021-02-18 NOTE — Lactation Note (Signed)
This note was copied from a baby's chart.  For d/c, mom given, coconut oil and lanolin packets with in struction in use, requests bottles of formula just in case she has problems at home, emergency use, 4 bottles Similac with Fe given with nipples, use small amts only, 15-20 cc.

## 2021-02-18 NOTE — Progress Notes (Signed)
Pt discharged with infant. Discharge instructions, prescriptions, and follow up appointments given to and reviewed with patient. Pt verbalized understanding. To be escorted out by auxillary.  °

## 2021-05-22 IMAGING — US US OB < 14 WEEKS - US OB TV
1 series · 15 of 28 positions shown · non-contrast
Comparison: None

CLINICAL DATA: Pregnancy of unknown anatomic location, LMP
05/18/2020; quantitative beta HCG 9988

EXAM:
OBSTETRIC <14 WK US AND TRANSVAGINAL OB US
TECHNIQUE: Both transabdominal and transvaginal ultrasound examinations were
performed for complete evaluation of the gestation as well as the
maternal uterus, adnexal regions, and pelvic cul-de-sac.
Transvaginal technique was performed to assess early pregnancy.

[Series 1: us ob comp less 14 wks · 15 of 127 slices shown]
[im 1/127]
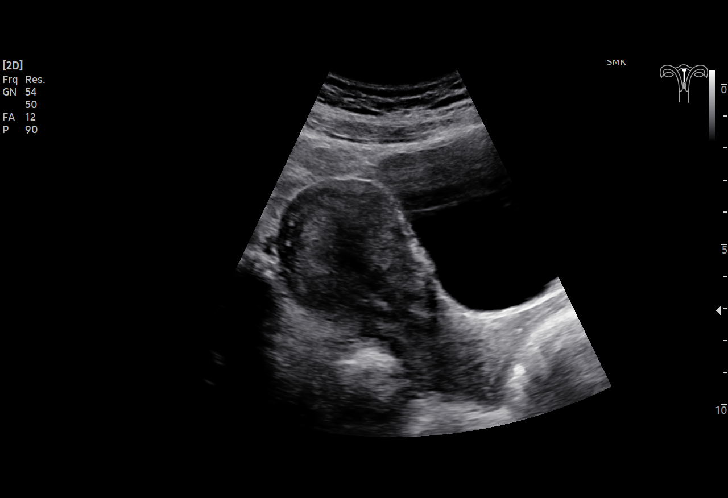
[im 10/127]
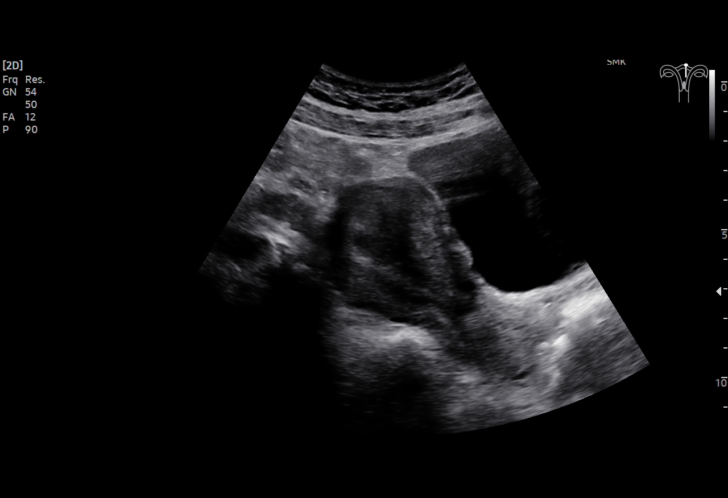
[im 19/127]
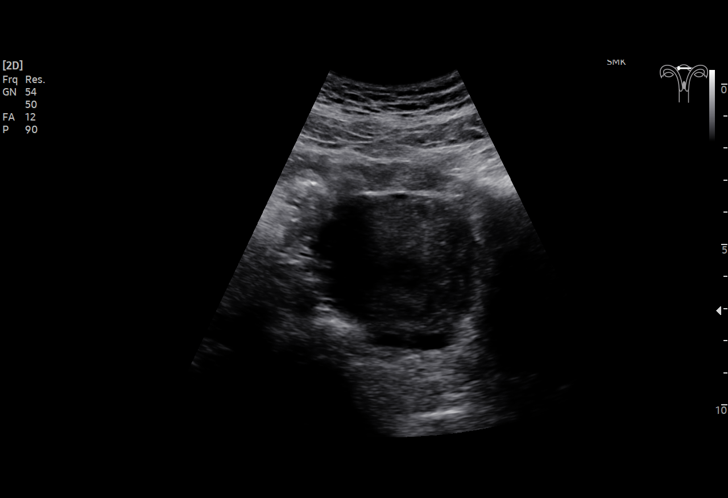
[im 29/127]
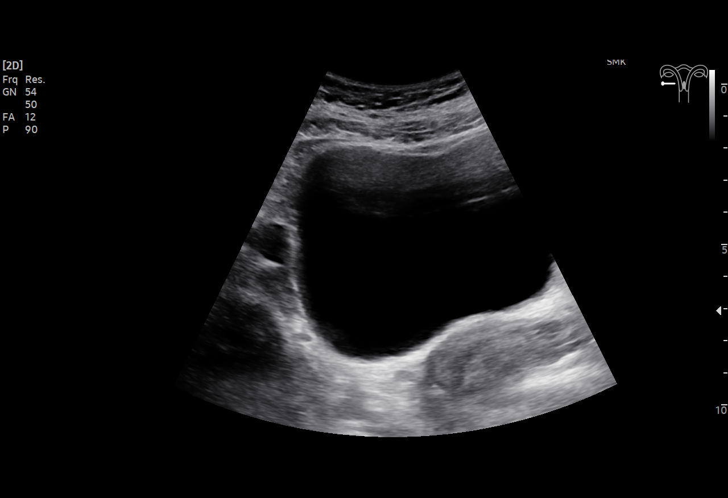
[im 38/127]
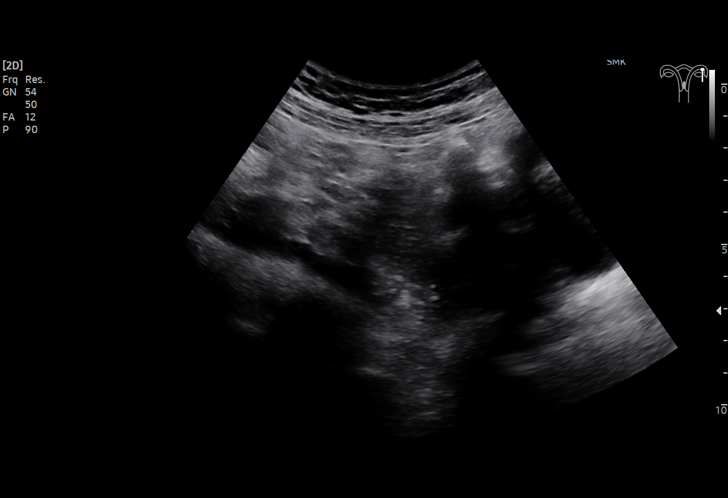
[im 47/127]
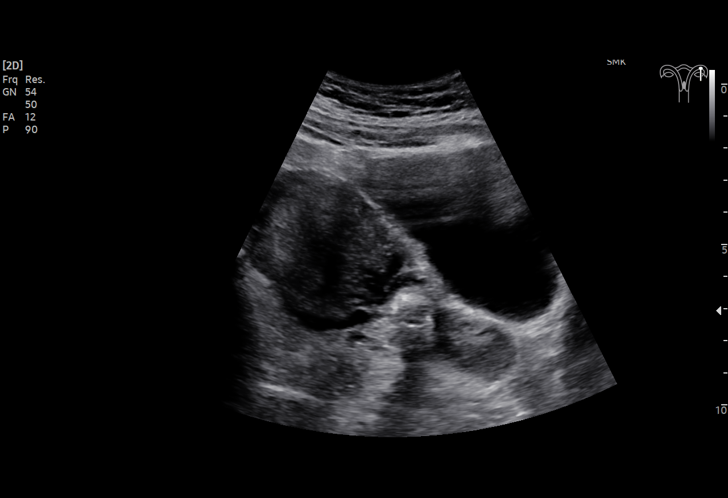
[im 57/127]
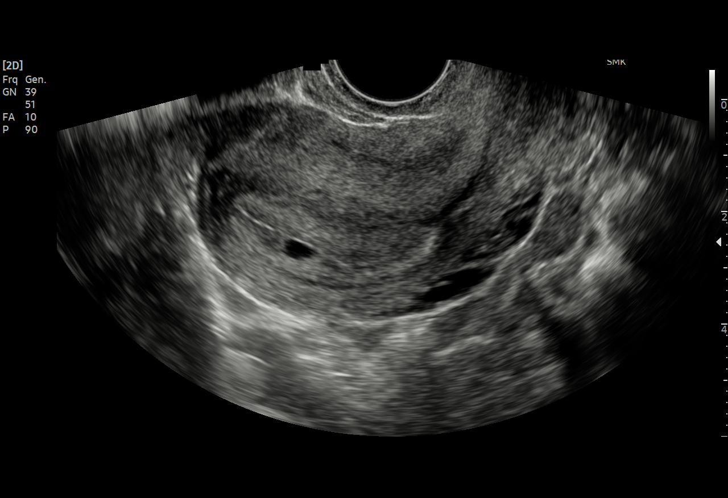
[im 66/127]
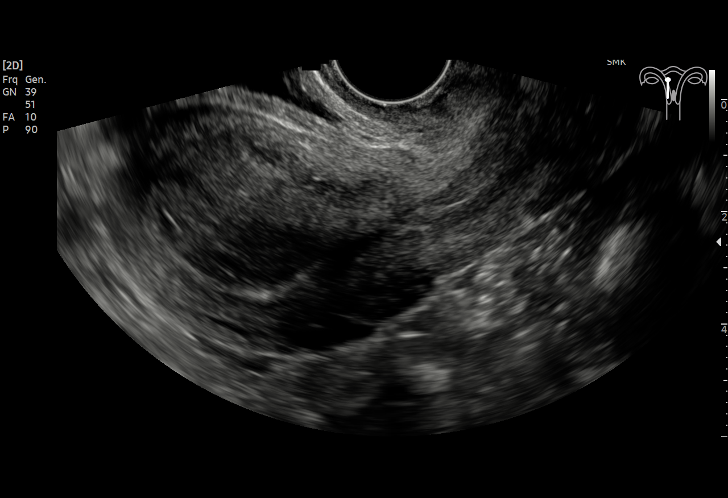
[im 71/127]
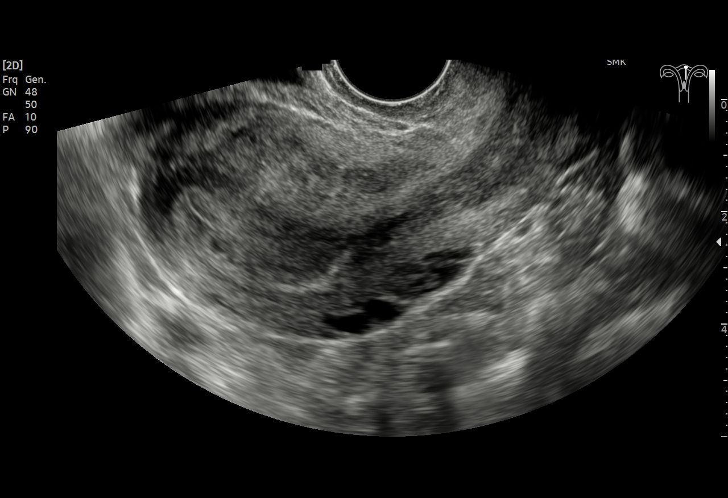
[im 80/127]
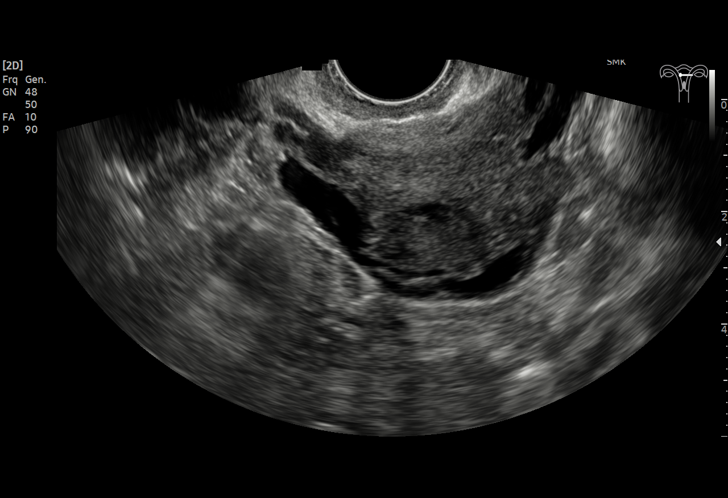
[im 89/127]
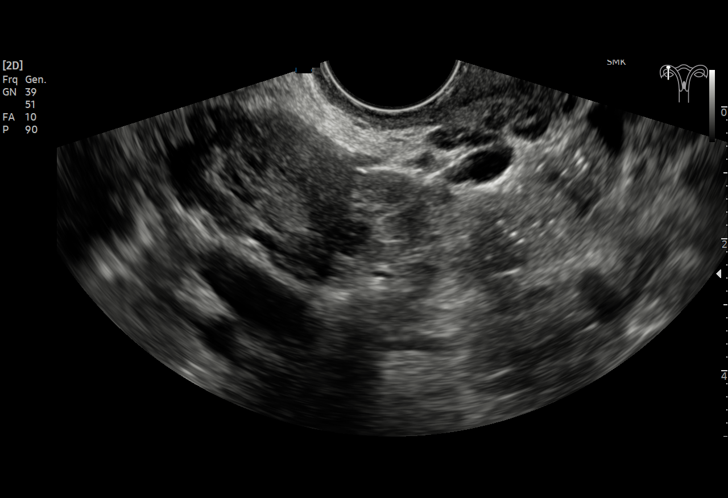
[im 99/127]
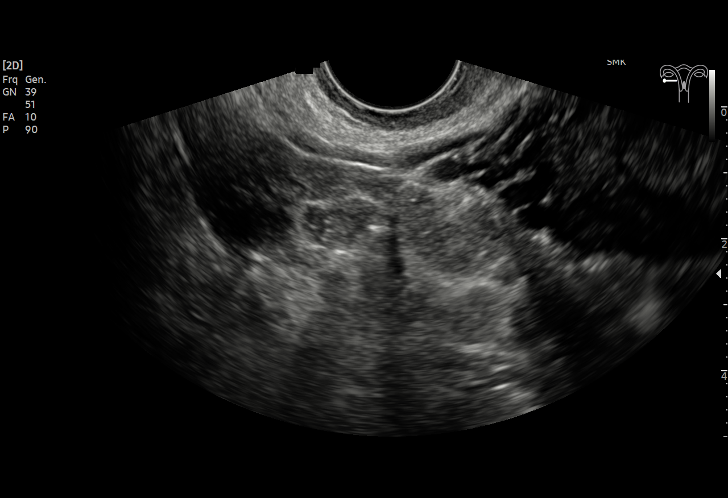
[im 108/127]
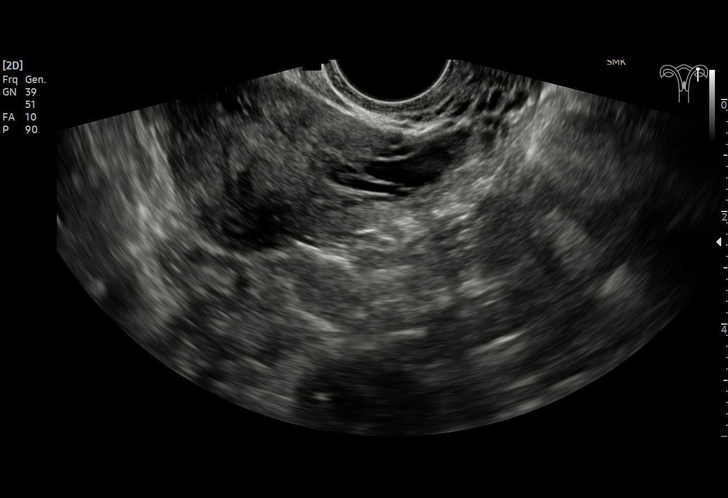
[im 117/127]
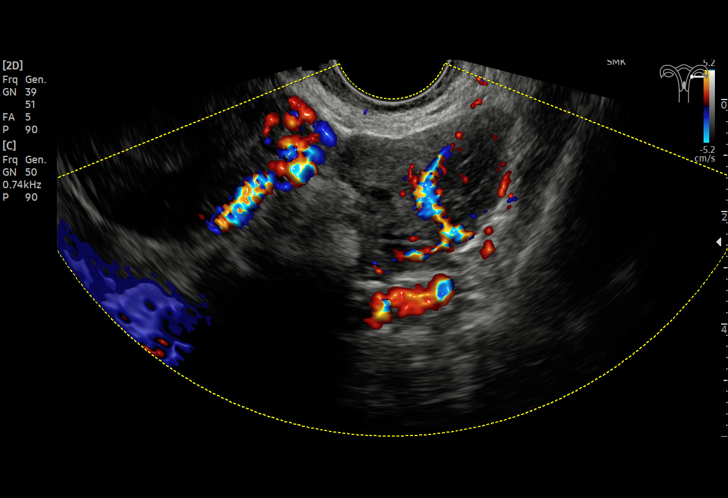
[im 127/127]
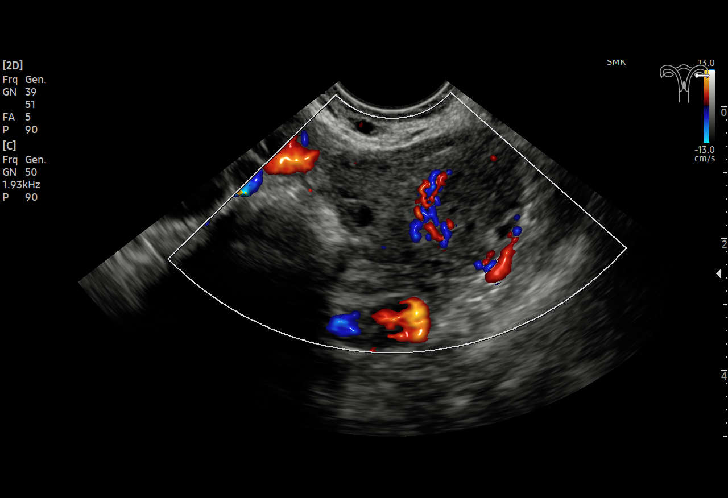

[15 of 28 positions shown; findings below may reference images not displayed]

FINDINGS: Intrauterine gestational sac: Present, single

Yolk sac:  Not identified

Embryo:  Not identified

Cardiac Activity: N/A

Heart Rate: N/A  bpm

MSD: 4.3 mm   5 w   1 d

Subchorionic hemorrhage:  None identified

Maternal uterus/adnexae:

RIGHT ovary normal size and morphology, 2.4 x 3.3 x 1.4 cm.

LEFT ovary measures 3.1 x 2.2 x 2.6 cm and contains a corpus luteum.

Uterus anteverted, otherwise unremarkable.

No free pelvic fluid or adnexal masses.
IMPRESSION: Small gestational sac identified within the uterus as above.

No fetal pole identified to establish viability; may consider
follow-up ultrasound in 14 days to establish viability, if
clinically indicated.

## 2021-11-27 ENCOUNTER — Ambulatory Visit
Admission: EM | Admit: 2021-11-27 | Discharge: 2021-11-27 | Disposition: A | Discharge status: Home / Self Care | Payer: BC Managed Care – PPO | Attending: Emergency Medicine | Admitting: Emergency Medicine

## 2021-11-27 ENCOUNTER — Encounter: Payer: Self-pay | Admitting: Emergency Medicine

## 2021-11-27 DIAGNOSIS — Z20822 Contact with and (suspected) exposure to covid-19: Secondary | ICD-10-CM | POA: Insufficient documentation

## 2021-11-27 DIAGNOSIS — J069 Acute upper respiratory infection, unspecified: Secondary | ICD-10-CM | POA: Diagnosis not present

## 2021-11-27 LAB — GROUP A STREP BY PCR: Group A Strep by PCR: NOT DETECTED

## 2021-11-27 LAB — SARS CORONAVIRUS 2 BY RT PCR: SARS Coronavirus 2 by RT PCR: NEGATIVE

## 2021-11-27 MED ORDER — IPRATROPIUM BROMIDE 0.06 % NA SOLN: 2.0000 | Freq: Four times a day (QID) | NASAL | 12 refills | Status: DC

## 2021-11-27 MED ORDER — BENZONATATE 100 MG PO CAPS: 200.0000 mg | ORAL_CAPSULE | Freq: Three times a day (TID) | ORAL | 0 refills | Status: DC

## 2021-11-27 NOTE — ED Triage Notes (Signed)
Patient c/o sore throat that started last night.  Patient denies fevers.

## 2021-11-27 NOTE — Discharge Instructions (Addendum)
Your testing today was negative for strep and COVID.  I do believe you have a viral respiratory infection.  Will prescribe nasal spray and cough medication to help you with your symptoms.  Use the Atrovent nasal spray every 6 hours to help with nasal congestion and postnasal drip which I believe is feeding her cough.  You can instill 2 squirts up each nostril.  Use the Tessalon Perles every 8 hours during the day as needed for cough.  They are a small gel tab.  Taken with a small sip of water.  You may experience some numbness to the base of your tongue or metallic taste in her mouth, this is normal.  You can gargle with warm salt water 2-3 times a day to help wash away drainage from her throat and soothe your sore throat pain.  You may also use over-the-counter Tylenol and ibuprofen as needed for any pain you have.  Chloraseptic and Sucrets lozenges are also helpful and using sore throat pain.  Do not use more than 1 lozenge every 2 hours as the menthol may cause diarrhea.  If you develop any new or worsening symptoms please return for reevaluation.

## 2021-11-27 NOTE — ED Provider Notes (Signed)
[facility]    CSN: [id number] Arrival date & time: 11/27/21  0805      History   Chief Complaint Chief Complaint  Patient presents with   Sore Throat    HPI Cynthia Anthony is a 26 y.o. female.   HPI  26 year old female here for evaluation of sore throat.  Patient reports that she developed a sore throat last night along with some pressure in her right ear, postnasal drip, and a nonproductive cough.  She denies fever, runny nose, nasal congestion, shortness of breath or wheezing, GI complaints, body ache, or headache.  She denies any known sick contacts but she does work in [employer] and encounters a lot of people.  Past Medical History:  Diagnosis Date   Anemia    Anxiety    GERD (gastroesophageal reflux disease)    with pregnancy    Patient Active Problem List   Diagnosis Date Noted   Cesarean delivery delivered 02/18/2021   Encounter for supervision of other normal pregnancy, third trimester 07/02/2020    Past Surgical History:  Procedure Laterality Date   CESAREAN SECTION N/A 02/16/2021   Procedure: CESAREAN SECTION;  Surgeon: Christeen Douglas, MD;  Location: [facility] ORS;  Service: Obstetrics;  Laterality: N/A;   NO PAST SURGERIES      OB History     Gravida  1   Para  1   Term  1   Preterm      AB      Living  1      SAB      IAB      Ectopic      Multiple  0   Live Births  1            Home Medications    Prior to Admission medications   Medication Sig Start Date End Date Taking? Authorizing Provider  benzonatate (TESSALON) 100 MG capsule Take 2 capsules (200 mg total) by mouth every 8 (eight) hours. 11/27/21  Yes Becky Augusta, NP  ipratropium (ATROVENT) 0.06 % nasal spray Place 2 sprays into both nostrils 4 (four) times daily. 11/27/21  Yes Becky Augusta, NP  ferrous sulfate 325 (65 FE) MG tablet Take 325 mg by mouth daily with breakfast.    [provider]  oxyCODONE (OXY IR/ROXICODONE) 5 MG immediate release  tablet Take 1-2 tablets (5-10 mg total) by mouth every 4 (four) hours as needed for moderate pain. 02/18/21   Haroldine Laws, CNM  Prenatal Vit-Fe Fumarate-FA (PRENATAL MULTIVITAMIN) TABS tablet Take 1 tablet by mouth daily at 12 noon.    [provider]  valACYclovir (VALTREX) 500 MG tablet Take 500 mg by mouth daily. 12/23/20   [provider]    Family History Family History  Problem Relation Age of Onset   Breast cancer Paternal Grandmother        late 45's    Social History Social History   Tobacco Use   Smoking status: Never   Smokeless tobacco: Never  Vaping Use   Vaping Use: Never used  Substance Use Topics   Alcohol use: No   Drug use: No     Allergies   Patient has no known allergies.   Review of Systems Review of Systems  Constitutional:  Negative for fever.  HENT:  Positive for ear pain, postnasal drip and sore throat. Negative for congestion and rhinorrhea.   Respiratory:  Positive for cough. Negative for shortness of breath and wheezing.   Gastrointestinal:  Negative  for diarrhea, nausea and vomiting.  Musculoskeletal:  Negative for arthralgias and myalgias.  Skin:  Negative for rash.  Neurological:  Negative for headaches.  Hematological: Negative.   Psychiatric/Behavioral: Negative.       Physical Exam Triage Vital Signs ED Triage Vitals  Enc Vitals Group     BP 11/27/21 0822 108/74     Pulse Rate 11/27/21 0822 89     Resp 11/27/21 0822 14     Temp 11/27/21 0822 98.8 F (37.1 C)     Temp Source 11/27/21 0822 Oral     SpO2 11/27/21 0822 99 %     Weight 11/27/21 0821 116 lb (52.6 kg)     Height 11/27/21 0821 5\' 2"  (1.575 m)     Head Circumference --      Peak Flow --      Pain Score 11/27/21 0820 7     Pain Loc --      Pain Edu? --      Excl. in [place]? --    No data found.  Updated Vital Signs BP 108/74 (BP Location: Left Arm)   Pulse 89   Temp 98.8 F (37.1 C) (Oral)   Resp 14   Ht 5\' 2"  (1.575 m)   Wt 116 lb (52.6  kg)   LMP 10/28/2021 (Approximate)   SpO2 99%   Breastfeeding No   BMI 21.22 kg/m   Visual Acuity Right Eye Distance:   Left Eye Distance:   Bilateral Distance:    Right Eye Near:   Left Eye Near:    Bilateral Near:     Physical Exam Vitals and nursing note reviewed.  Constitutional:      Appearance: Normal appearance. She is not ill-appearing.  HENT:     Head: Normocephalic and atraumatic.     Right Ear: Tympanic membrane, ear canal and external ear normal. There is no impacted cerumen.     Left Ear: Tympanic membrane, ear canal and external ear normal. There is no impacted cerumen.     Nose: Congestion and rhinorrhea present.     Mouth/Throat:     Mouth: Mucous membranes are moist.     Pharynx: Oropharynx is clear. Posterior oropharyngeal erythema present. No oropharyngeal exudate.  Cardiovascular:     Rate and Rhythm: Normal rate and regular rhythm.     Pulses: Normal pulses.     Heart sounds: Normal heart sounds. No murmur heard.    No friction rub. No gallop.  Pulmonary:     Effort: Pulmonary effort is normal.     Breath sounds: Normal breath sounds. No wheezing, rhonchi or rales.  Musculoskeletal:     Cervical back: Normal range of motion and neck supple.  Lymphadenopathy:     Cervical: No cervical adenopathy.  Skin:    General: Skin is warm and dry.     Capillary Refill: Capillary refill takes less than 2 seconds.     Findings: No rash.  Neurological:     General: No focal deficit present.     Mental Status: She is alert and oriented to person, place, and time.  Psychiatric:        Mood and Affect: Mood normal.        Behavior: Behavior normal.        Thought Content: Thought content normal.        Judgment: Judgment normal.      UC Treatments / Results  Labs (all labs ordered are listed, but only abnormal results are displayed)  Labs Reviewed  GROUP A STREP BY PCR  SARS CORONAVIRUS 2 BY RT PCR    EKG   Radiology No results  found.  Procedures Procedures (including critical care time)  Medications Ordered in UC Medications - No data to display  Initial Impression / Assessment and Plan / UC Course  I have reviewed the triage vital signs and the nursing notes.  Pertinent labs & imaging results that were available during my care of the patient were reviewed by me and considered in my medical decision making (see chart for details).  Patient is a very pleasant, nontoxic-appearing 26 year old female here for evaluation of sore throat and upper respiratory symptoms as outlined in HPI above.  Her symptoms began yesterday.  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  Physical exam reveals pearly-gray tympanic membranes bilaterally.  Both external auditory canals are mildly ceruminous.  Nasal mucosa is erythematous and edematous with scant clear discharge in both nares.  Oropharyngeal exam reveals benign tonsillar pillars.  Posterior oropharynx is mildly erythematous and there is clear postnasal drip present on exam.  No cervical lymphadenopathy appreciated exam.  Cardiopulmonary exam reveals S1-S2 heart sounds with regular rate and rhythm and lung sounds that are clear to auscultation all fields.  Strep PCR was collected at triage.  I will also order a COVID PCR given that patient has upper respiratory findings on exam.  Strep PCR is negative.  COVID PCR is negative.  I will discharge patient with a diagnosis of viral URI with cough.  I will prescribe Tessalon Perles that she can use for cough and symmetric nasal spray to help the congestion and postnasal drip which I believe is feeding her sore throat.  Return precautions reviewed.   Final Clinical Impressions(s) / UC Diagnoses   Final diagnoses:  Viral URI with cough     Discharge Instructions      Your testing today was negative for strep and COVID.  I do believe you have a viral respiratory infection.   Will prescribe nasal spray and cough medication to help you with your symptoms.  Use the Atrovent nasal spray every 6 hours to help with nasal congestion and postnasal drip which I believe is feeding her cough.  You can instill 2 squirts up each nostril.  Use the Tessalon Perles every 8 hours during the day as needed for cough.  They are a small gel tab.  Taken with a small sip of water.  You may experience some numbness to the base of your tongue or metallic taste in her mouth, this is normal.  You can gargle with warm salt water 2-3 times a day to help wash away drainage from her throat and soothe your sore throat pain.  You may also use over-the-counter Tylenol and ibuprofen as needed for any pain you have.  Chloraseptic and Sucrets lozenges are also helpful and using sore throat pain.  Do not use more than 1 lozenge every 2 hours as the menthol may cause diarrhea.  If you develop any new or worsening symptoms please return for reevaluation.     ED Prescriptions     Medication Sig Dispense Auth. Provider   benzonatate (TESSALON) 100 MG capsule Take 2 capsules (200 mg total) by mouth every 8 (eight) hours. 21 capsule Becky Augusta, NP   ipratropium (ATROVENT) 0.06 % nasal spray Place 2 sprays into both nostrils 4 (four) times daily. 15 mL  Margarette Canada, NP      PDMP not reviewed this encounter.   Margarette Canada, NP 11/27/21 [phone]

## 2023-10-01 NOTE — Progress Notes (Signed)
 No LMP recorded (lmp unknown). Patient is pregnant. Estimated Date of Delivery: 12/21/23  28 y.o. G2P1001 at [redacted]w[redacted]d  The primary encounter diagnosis was Encounter for supervision of normal first pregnancy in third trimester ([facility]). Diagnoses of History of cesarean delivery, currently pregnant ([facility]-HCC) and Anxiety were also pertinent to this visit.  S:   Patient concerns today:  - none  Chief Complaint  Patient presents with  . Routine Prenatal Visit    Reports: no problems  Denies bleeding, contractions, cramping or leaking.   O:   See [facility] flowsheets. BP 110/75   Pulse 89   Ht 157 cm (5' 1.81)   Wt 60.1 kg (132 lb 9.6 oz)   LMP  (LMP Unknown)   BMI 24.40 kg/m  Gen: NAD  Pulm: No use of accessory muscles, normal respirations Abdomen: Gravid, nontender  Fundal height: 28 cm  FHT by doppler (distinguished from mom): 137bpm  S=D Pelvic: SVE  Ext : no edema, no rashes Psych: Mood, insight, judgement intact  A/P:  28 y.o. G2P1001 at [redacted]w[redacted]d   H/o Cesarean delivery x 1 c/s d/t maternal request (2022) with BEB Desires repeat c/section with BEB H/o Anxiety No medications   - Problem list reviewed and/or updated. - 1h GTT, CBC, UA completed  - Rh: pos - Tdap: declined - Blood consent: reviewed and signed today - Contraception: undecided - Feeding: Breastfeeding  - Return in about 2 weeks (around 10/18/2023) for Routine OB visit.    Attestation Statement:   I personally performed the service, non-incident to. [facility])   JENNIFER ELAINE MYRON DELON MYRON, CNM 10/04/2023 1:01 PM

## 2023-11-22 NOTE — Procedures (Signed)
 Fetal nonstress test  Date/Time: 11/22/2023 9:37 PM  Performed by: Asberry Amadeo Porter Sherre, MD Authorized by: Alfonso GORMAN Guan, MD       Non Stress Test Procedure Note  Patient: Cynthia Anthony Gestational Age: [redacted]w[redacted]d Date: 11/22/2023  Indication: painful contractions    FHT: Baseline HR 140-145, moderate variability, positive accelerations, negative decelerations   Tocometer: Regular contractions q 2-4 min  Impression: Reactive  Amadeo Sherre, MD Obstetrics & Gynecology, PGY-2

## 2023-11-22 NOTE — Nursing Note (Signed)
 Cynthia Most MD at bedside to assess patient.

## 2024-02-09 ENCOUNTER — Encounter: Payer: Self-pay | Admitting: Emergency Medicine

## 2024-02-09 ENCOUNTER — Ambulatory Visit
Admission: EM | Admit: 2024-02-09 | Discharge: 2024-02-09 | Disposition: A | Discharge status: Home / Self Care | Attending: Physician Assistant | Admitting: Physician Assistant

## 2024-02-09 DIAGNOSIS — J069 Acute upper respiratory infection, unspecified: Secondary | ICD-10-CM

## 2024-02-09 DIAGNOSIS — R051 Acute cough: Secondary | ICD-10-CM

## 2024-02-09 DIAGNOSIS — J029 Acute pharyngitis, unspecified: Secondary | ICD-10-CM

## 2024-02-09 LAB — SARS CORONAVIRUS 2 BY RT PCR: SARS Coronavirus 2 by RT PCR: NEGATIVE

## 2024-02-09 LAB — GROUP A STREP BY PCR: Group A Strep by PCR: NOT DETECTED

## 2024-02-09 MED ORDER — PROMETHAZINE-DM 6.25-15 MG/5ML PO SYRP: 5.0000 mL | ORAL_SOLUTION | Freq: Four times a day (QID) | ORAL | 0 refills | Status: AC | PRN

## 2024-02-09 MED ORDER — LIDOCAINE VISCOUS HCL 2 % MT SOLN: 15.0000 mL | OROMUCOSAL | 0 refills | Status: AC | PRN

## 2024-02-09 NOTE — Discharge Instructions (Addendum)
-  Negative strep -Negative COVID  URI/COLD SYMPTOMS: Your exam today is consistent with a viral illness. Antibiotics are not indicated at this time. Use medications as directed, including cough syrup, nasal saline, and decongestants. Your symptoms should improve over the next few days and resolve within 7-10 days. Increase rest and fluids. F/u if symptoms worsen or predominate such as sore throat, ear pain, productive cough, shortness of breath, or if you develop high fevers or worsening fatigue over the next several days.

## 2024-02-09 NOTE — ED Provider Notes (Signed)
 [facility]    CSN: [id number] Arrival date & time: 02/09/24  1517      History   Chief Complaint Chief Complaint  Patient presents with   Cough   Sore Throat    HPI Cynthia Anthony is a 28 y.o. female presenting for 3-day history of low-grade fever up to 100 degrees, fatigue, cough, mild congestion, severe sore throat, body aches.  Denies headaches, chest pain, wheezing or shortness of breath, vomiting or diarrhea.  Denies any sick contacts.  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  She has taken ibuprofen  about 4 hours ago.  Current temperature 98.7 degrees.  No other complaints.  HPI  Past Medical History:  Diagnosis Date   Anemia    Anxiety    GERD (gastroesophageal reflux disease)    with pregnancy    Patient Active Problem List   Diagnosis Date Noted   Cesarean delivery delivered 02/18/2021   Encounter for supervision of other normal pregnancy, third trimester 07/02/2020    Past Surgical History:  Procedure Laterality Date   CESAREAN SECTION N/A 02/16/2021   Procedure: CESAREAN SECTION;  Surgeon: Verdon Keen, MD;  Location: [facility] ORS;  Service: Obstetrics;  Laterality: N/A;   CESAREAN SECTION     NO PAST SURGERIES      OB History     Gravida  1   Para  1   Term  1   Preterm      AB      Living  1      SAB      IAB      Ectopic      Multiple  0   Live Births  1            Home Medications    Prior to Admission medications   Medication Sig Start Date End Date Taking? Authorizing Provider  lidocaine (XYLOCAINE) 2 % solution Use as directed 15 mLs in the mouth or throat every 3 (three) hours as needed for mouth pain (swish and spit). 02/09/24  Yes Arvis Jolan NOVAK, PA-C  promethazine-dextromethorphan (PROMETHAZINE-DM) 6.25-15 MG/5ML syrup Take 5 mLs by mouth 4 (four) times daily as needed for cough. 02/09/24  Yes Arvis Jolan NOVAK, PA-C  Prenatal Vit-Fe Fumarate-FA  (PRENATAL MULTIVITAMIN) TABS tablet Take 1 tablet by mouth daily at 12 noon.    [provider]  valACYclovir (VALTREX) 500 MG tablet Take 500 mg by mouth daily. 12/23/20   [provider]    Family History Family History  Problem Relation Age of Onset   Breast cancer Paternal Grandmother        late 64's    Social History Social History   Tobacco Use   Smoking status: Never   Smokeless tobacco: Never  Vaping Use   Vaping status: Never Used  Substance Use Topics   Alcohol use: No   Drug use: No     Allergies   Patient has no known allergies.   Review of Systems Review of Systems  Constitutional:  Positive for chills, fatigue and fever.  HENT:  Positive for congestion, rhinorrhea and sore throat. Negative for ear pain, sinus pressure and sinus pain.   Respiratory:  Positive for cough. Negative for shortness of breath.   Cardiovascular:  Negative for chest pain.  Gastrointestinal:  Negative for abdominal pain, nausea and vomiting.  Musculoskeletal:  Positive for myalgias.  Skin:  Negative for rash.  Neurological:  Negative  for weakness and headaches.  Hematological:  Negative for adenopathy.     Physical Exam Triage Vital Signs ED Triage Vitals  Encounter Vitals Group     BP 02/09/24 1534 123/79     Girls Systolic BP Percentile --      Girls Diastolic BP Percentile --      Boys Systolic BP Percentile --      Boys Diastolic BP Percentile --      Pulse Rate 02/09/24 1534 75     Resp 02/09/24 1534 14     Temp 02/09/24 1534 98.7 F (37.1 C)     Temp Source 02/09/24 1534 Oral     SpO2 02/09/24 1534 99 %     Weight 02/09/24 1532 115 lb 15.4 oz (52.6 kg)     Height 02/09/24 1532 5' 2 (1.575 m)     Head Circumference --      Peak Flow --      Pain Score 02/09/24 1532 8     Pain Loc --      Pain Education --      Exclude from Growth Chart --    No data found.  Updated Vital Signs BP 123/79 (BP Location: Left Arm)   Pulse 75   Temp 98.7 F  (37.1 C) (Oral)   Resp 14   Ht 5' 2 (1.575 m)   Wt 115 lb 15.4 oz (52.6 kg)   SpO2 99%   Breastfeeding No   BMI 21.21 kg/m    Physical Exam Vitals and nursing note reviewed.  Constitutional:      General: She is not in acute distress.    Appearance: Normal appearance. She is not ill-appearing or toxic-appearing.  HENT:     Head: Normocephalic and atraumatic.     Nose: Congestion present.     Mouth/Throat:     Mouth: Mucous membranes are moist.     Pharynx: Oropharynx is clear. Posterior oropharyngeal erythema present.  Eyes:     General: No scleral icterus.       Right eye: No discharge.        Left eye: No discharge.     Conjunctiva/sclera: Conjunctivae normal.  Cardiovascular:     Rate and Rhythm: Normal rate and regular rhythm.     Heart sounds: Normal heart sounds.  Pulmonary:     Effort: Pulmonary effort is normal. No respiratory distress.     Breath sounds: Normal breath sounds.  Musculoskeletal:     Cervical back: Neck supple.  Skin:    General: Skin is dry.  Neurological:     General: No focal deficit present.     Mental Status: She is alert. Mental status is at baseline.     Motor: No weakness.     Gait: Gait normal.  Psychiatric:        Mood and Affect: Mood normal.        Behavior: Behavior normal.      UC Treatments / Results  Labs (all labs ordered are listed, but only abnormal results are displayed) Labs Reviewed  GROUP A STREP BY PCR  SARS CORONAVIRUS 2 BY RT PCR    EKG   Radiology No results found.  Procedures Procedures (including critical care time)  Medications Ordered in UC Medications - No data to display  Initial Impression / Assessment and Plan / UC Course  I have reviewed the triage vital signs and the nursing notes.  Pertinent labs & imaging results that were available during my care  of the patient were reviewed by me and considered in my medical decision making (see chart for details).   28 year old female presents  for fever, sore throat, congestion, cough, body aches x 3 days.  Vitals are all stable and normal and she is overall well-appearing.  No acute distress.  On exam she has nasal congestion and erythema posterior pharynx.  Chest clear.  Heart regular rate and rhythm.  PCR strep and COVID testing obtained.  Negative testing. Reviewed all results with patient.  Viral illness. Supportive care encouraged with increasing rest and fluids.  Continue antipyretics as needed. Sent promethazine DM and viscous lidocaine. If fever is not breaking in a few days, cough worsens, she develops chest pain, shortness of breath, increased weakness, worsening sore throat she should be seen again right away.  If any point she cannot manage her secretions she should be seen in the ED.   Final Clinical Impressions(s) / UC Diagnoses   Final diagnoses:  Viral upper respiratory tract infection  Sore throat  Acute cough     Discharge Instructions      -Negative strep -Negative COVID  URI/COLD SYMPTOMS: Your exam today is consistent with a viral illness. Antibiotics are not indicated at this time. Use medications as directed, including cough syrup, nasal saline, and decongestants. Your symptoms should improve over the next few days and resolve within 7-10 days. Increase rest and fluids. F/u if symptoms worsen or predominate such as sore throat, ear pain, productive cough, shortness of breath, or if you develop high fevers or worsening fatigue over the next several days.       ED Prescriptions     Medication Sig Dispense Auth. Provider   lidocaine (XYLOCAINE) 2 % solution Use as directed 15 mLs in the mouth or throat every 3 (three) hours as needed for mouth pain (swish and spit). 100 mL Arvis Huxley B, PA-C   promethazine-dextromethorphan (PROMETHAZINE-DM) 6.25-15 MG/5ML syrup Take 5 mLs by mouth 4 (four) times daily as needed for cough. 118 mL Arvis Huxley NOVAK, PA-C      PDMP not reviewed this encounter.    Arvis Huxley NOVAK, PA-C 02/10/24 [phone]

## 2024-02-09 NOTE — ED Triage Notes (Signed)
 Patient c/o cough, sore throat, bodyaches and fever that started on Wed.

## 2024-02-11 ENCOUNTER — Ambulatory Visit
Admission: EM | Admit: 2024-02-11 | Discharge: 2024-02-11 | Disposition: A | Discharge status: Home / Self Care | Attending: Family Medicine | Admitting: Family Medicine

## 2024-02-11 DIAGNOSIS — J069 Acute upper respiratory infection, unspecified: Secondary | ICD-10-CM | POA: Insufficient documentation

## 2024-02-11 LAB — GROUP A STREP BY PCR: Group A Strep by PCR: NOT DETECTED

## 2024-02-11 LAB — RESP PANEL BY RT-PCR (RSV, FLU A&B, COVID)  RVPGX2
Influenza A by PCR: NEGATIVE
Influenza B by PCR: NEGATIVE
Resp Syncytial Virus by PCR: NEGATIVE
SARS Coronavirus 2 by RT PCR: NEGATIVE

## 2024-02-11 NOTE — ED Triage Notes (Signed)
 Sore throat with white patches, cough, wheezing x 6 days. Was seen on 10/11 and states she is not feeling any better, and negative for strep but would like to be re-tested.

## 2024-02-11 NOTE — Discharge Instructions (Signed)
 Your strep, RSV, influenza and COVID are negative. Consider mononucleosis testing. You have a viral respiratory infection that will gradually improve over the next 7-10 days. Cough may last up to 3 weeks.    If your were prescribed medication, stop by the pharmacy to pick them up.   You can take Tylenol  and/or Ibuprofen  as needed for fever reduction and pain relief.    For cough: honey 1/2 to 1 teaspoon (you can dilute the honey in water or another fluid). You can also use guaifenesin and dextromethorphan for cough.  You can use a humidifier for chest congestion and cough.  If you don't have a humidifier, you can sit in the bathroom with the hot shower running.      For sore throat: try warm salt water gargles, Mucinex sore throat cough drops or cepacol lozenges, throat spray, warm tea or water with lemon/honey, popsicles or ice, or OTC cold relief medicine for throat discomfort. You can also purchase chloraseptic spray at the pharmacy or dollar store.    For congestion: take a daily anti-histamine like Zyrtec, Claritin, and a oral decongestant, such as pseudoephedrine.  You can also use Flonase 1-2 sprays in each nostril daily. Afrin is also a good option, if you do not have high blood pressure.    It is important to stay hydrated: drink plenty of fluids (water, gatorade/powerade/pedialyte, juices, or teas) to keep your throat moisturized and help further relieve irritation/discomfort.    Return or go to the Emergency Department if symptoms worsen or do not improve in the next few days

## 2024-02-11 NOTE — ED Provider Notes (Signed)
 [facility]    CSN: [id number] Arrival date & time: 02/11/24  0920      History   Chief Complaint Chief Complaint  Patient presents with   Sore Throat   Cough    HPI Cynthia Anthony is a 28 y.o. female.   HPI  History obtained from the patient. Cynthia Anthony presents for cough and sore throat that has gotten worse. Cynthia Anthony is not getting any sleep due to the cough and throat pain. Cynthia Anthony has been taking Tylenol  and ibuprofen  without relief. Cough is now productive. Feels like her throat is swollen in the back. Now, Cynthia Anthony has white patches in the back of her throat.  Has had some diarrhea but no vomiting or belly pain.  No known sick contacts.  Cynthia Anthony was seen here a couple days ago.  Requests retesting.  Of note, Cynthia Anthony has a 38-month-old at home and Cynthia Anthony wants to be sure Cynthia Anthony is not giving her child anything.  No history of asthma.  Denies vaping of smoking.   Past Medical History:  Diagnosis Date   Anemia    Anxiety    GERD (gastroesophageal reflux disease)    with pregnancy    Patient Active Problem List   Diagnosis Date Noted   Cesarean delivery delivered 02/18/2021   Encounter for supervision of other normal pregnancy, third trimester 07/02/2020    Past Surgical History:  Procedure Laterality Date   CESAREAN SECTION N/A 02/16/2021   Procedure: CESAREAN SECTION;  Surgeon: Verdon Keen, MD;  Location: [facility] ORS;  Service: Obstetrics;  Laterality: N/A;   CESAREAN SECTION     NO PAST SURGERIES      OB History     Gravida  1   Para  1   Term  1   Preterm      AB      Living  1      SAB      IAB      Ectopic      Multiple  0   Live Births  1            Home Medications    Prior to Admission medications   Medication Sig Start Date End Date Taking? Authorizing Provider  valACYclovir (VALTREX) 500 MG tablet Take 500 mg by mouth daily. 12/23/20  Yes [provider]  lidocaine (XYLOCAINE) 2 % solution Use as directed 15 mLs in the  mouth or throat every 3 (three) hours as needed for mouth pain (swish and spit). 02/09/24   Arvis Jolan NOVAK, PA-C  Prenatal Vit-Fe Fumarate-FA (PRENATAL MULTIVITAMIN) TABS tablet Take 1 tablet by mouth daily at 12 noon.    [provider]  promethazine-dextromethorphan (PROMETHAZINE-DM) 6.25-15 MG/5ML syrup Take 5 mLs by mouth 4 (four) times daily as needed for cough. 02/09/24   Arvis Jolan NOVAK, PA-C    Family History Family History  Problem Relation Age of Onset   Breast cancer Paternal Grandmother        late 42's    Social History Social History   Tobacco Use   Smoking status: Never   Smokeless tobacco: Never  Vaping Use   Vaping status: Never Used  Substance Use Topics   Alcohol use: No   Drug use: No     Allergies   Patient has no known allergies.   Review of Systems Review of Systems: negative unless otherwise stated in HPI.      Physical Exam Triage Vital Signs ED Triage Vitals  Encounter Vitals  Group     BP 02/11/24 0951 114/80     Girls Systolic BP Percentile --      Girls Diastolic BP Percentile --      Boys Systolic BP Percentile --      Boys Diastolic BP Percentile --      Pulse Rate 02/11/24 0951 80     Resp 02/11/24 0951 16     Temp 02/11/24 0951 99.2 F (37.3 C)     Temp Source 02/11/24 0951 Oral     SpO2 02/11/24 0951 98 %     Weight --      Height --      Head Circumference --      Peak Flow --      Pain Score 02/11/24 0944 10     Pain Loc --      Pain Education --      Exclude from Growth Chart --    No data found.  Updated Vital Signs BP 114/80 (BP Location: Left Arm)   Pulse 80   Temp 99.2 F (37.3 C) (Oral)   Resp 16   LMP  (LMP Unknown)   SpO2 98%   Breastfeeding No   Visual Acuity Right Eye Distance:   Left Eye Distance:   Bilateral Distance:    Right Eye Near:   Left Eye Near:    Bilateral Near:     Physical Exam GEN:     alert, non-toxic appearing female in no distress    HENT:  mucus membranes moist,  oropharyngeal  without lesions or erythema, 1+ tonsillar hypertrophy with white exudates, no nasal discharge EYES:   pupils equal and reactive, no scleral injection or discharge NECK:  normal ROM, +lymphadenopathy, no meningismus   RESP:  no increased work of breathing, clear to auscultation bilaterally CVS:   regular rate and rhythm Skin:   warm and dry, no rash on visible skin    UC Treatments / Results  Labs (all labs ordered are listed, but only abnormal results are displayed) Labs Reviewed  GROUP A STREP BY PCR  RESP PANEL BY RT-PCR (RSV, FLU A&B, COVID)  [id number]    EKG   Radiology No results found.   Procedures Procedures (including critical care time)  Medications Ordered in UC Medications - No data to display  Initial Impression / Assessment and Plan / UC Course  I have reviewed the triage vital signs and the nursing notes.  Pertinent labs & imaging results that were available during my care of the patient were reviewed by me and considered in my medical decision making (see chart for details).       Pt is a 28 y.o. female who presents for 5 days of respiratory symptoms. Alis is afebrile here. Satting well on room air. Overall pt is non-toxic appearing, well hydrated, without respiratory distress. Pulmonary exam is unremarkable.  Cynthia Anthony has a 22 month old newborn at home. COVID, RSV and influenza panel obtained and was negative. Strep PCR is negative.  Pt declined mono testing.  Cynthia Anthony tried lidocaine solution in the past but doesn't feel like its helping here.   History consistent with viral respiratory illness. Discussed symptomatic treatment.  Explained lack of efficacy of antibiotics in viral disease.  Typical duration of symptoms discussed. Declined lidocaine solution prescription. Gargle with warm salty water.   Return and ED precautions given and voiced understanding. Discussed MDM, treatment plan and plan for follow-up with patient  who agrees with plan.  Final Clinical Impressions(s) / UC Diagnoses   Final diagnoses:  Viral URI with cough     Discharge Instructions      Your strep, RSV, influenza and COVID are negative. Consider mononucleosis testing. You have a viral respiratory infection that will gradually improve over the next 7-10 days. Cough may last up to 3 weeks.    If your were prescribed medication, stop by the pharmacy to pick them up.   You can take Tylenol  and/or Ibuprofen  as needed for fever reduction and pain relief.    For cough: honey 1/2 to 1 teaspoon (you can dilute the honey in water or another fluid). You can also use guaifenesin and dextromethorphan for cough.  You can use a humidifier for chest congestion and cough.  If you don't have a humidifier, you can sit in the bathroom with the hot shower running.      For sore throat: try warm salt water gargles, Mucinex sore throat cough drops or cepacol lozenges, throat spray, warm tea or water with lemon/honey, popsicles or ice, or OTC cold relief medicine for throat discomfort. You can also purchase chloraseptic spray at the pharmacy or dollar store.    For congestion: take a daily anti-histamine like Zyrtec, Claritin, and a oral decongestant, such as pseudoephedrine.  You can also use Flonase 1-2 sprays in each nostril daily. Afrin is also a good option, if you do not have high blood pressure.    It is important to stay hydrated: drink plenty of fluids (water, gatorade/powerade/pedialyte, juices, or teas) to keep your throat moisturized and help further relieve irritation/discomfort.    Return or go to the Emergency Department if symptoms worsen or do not improve in the next few days       ED Prescriptions   None    PDMP not reviewed this encounter.   Kriste Berth, DO 02/11/24 1234
